# Patient Record
Sex: Male | Born: 1955 | Race: White | Hispanic: Refuse to answer | Marital: Married | Smoking: Never smoker
Health system: Southern US, Community
[De-identification: ages and names within clinical notes are randomized; demographics above are authoritative.]

## PROBLEM LIST (undated history)

## (undated) HISTORY — PX: CARPAL TUNNEL RELEASE: SHX101

## (undated) HISTORY — PX: CORNEAL TRANSPLANT: SHX108

---

## 2014-05-16 ENCOUNTER — Emergency Department (INDEPENDENT_AMBULATORY_CARE_PROVIDER_SITE_OTHER): Payer: Self-pay

## 2014-05-16 ENCOUNTER — Encounter (HOSPITAL_COMMUNITY): Payer: Self-pay | Admitting: Emergency Medicine

## 2014-05-16 ENCOUNTER — Emergency Department (INDEPENDENT_AMBULATORY_CARE_PROVIDER_SITE_OTHER)
Admission: EM | Admit: 2014-05-16 | Discharge: 2014-05-16 | Disposition: A | Discharge status: Home / Self Care | Payer: Self-pay | Source: Home / Self Care | Attending: Family Medicine | Admitting: Family Medicine

## 2014-05-16 DIAGNOSIS — S60552A Superficial foreign body of left hand, initial encounter: Secondary | ICD-10-CM

## 2014-05-16 DIAGNOSIS — Z23 Encounter for immunization: Secondary | ICD-10-CM

## 2014-05-16 MED ORDER — TETANUS-DIPHTH-ACELL PERTUSSIS 5-2.5-18.5 LF-MCG/0.5 IM SUSP
0.5000 mL | Freq: Once | INTRAMUSCULAR | Status: AC
  Administered 2014-05-16: 0.5 mL via INTRAMUSCULAR

## 2014-05-16 MED ORDER — HYDROCODONE-ACETAMINOPHEN 5-325 MG PO TABS: 1.0000 | ORAL_TABLET | ORAL | Status: DC | PRN

## 2014-05-16 MED ORDER — TETANUS-DIPHTH-ACELL PERTUSSIS 5-2.5-18.5 LF-MCG/0.5 IM SUSP
INTRAMUSCULAR | Status: AC
  Filled 2014-05-16: qty 0.5

## 2014-05-16 MED ORDER — AMOXICILLIN-POT CLAVULANATE 875-125 MG PO TABS: 1.0000 | ORAL_TABLET | Freq: Two times a day (BID) | ORAL | Status: DC

## 2014-05-16 MED ORDER — LIDOCAINE-EPINEPHRINE (PF) 2 %-1:200000 IJ SOLN
INTRAMUSCULAR | Status: AC
  Filled 2014-05-16: qty 20

## 2014-05-16 NOTE — ED Provider Notes (Signed)
CSN: [id number]     Arrival date & time 05/16/14  1737 History   First MD Initiated Contact with Patient 05/16/14 1802     Chief Complaint  Patient presents with  . Hand Injury   (Consider location/radiation/quality/duration/timing/severity/associated sxs/prior Treatment) Patient is a 59 y.o. male presenting with hand injury. The history is provided by the patient and the spouse.  Hand Injury Location:  Hand Time since incident:  2 hours Injury: yes   Mechanism of injury comment:  Puncture wound from wood paneling to left midpalm, with resultant fb palp and tingling to lif, no bleeding. Hand location:  L palm Pain details:    Quality:  Tingling and sharp   Severity:  Mild   Onset quality:  Sudden   Progression:  Unchanged Chronicity:  New Dislocation: no   Tetanus status:  Out of date Prior injury to area:  No Associated symptoms: numbness and tingling     History reviewed. No pertinent past medical history. History reviewed. No pertinent past surgical history. No family history on file. History  Substance Use Topics  . Smoking status: Never Smoker   . Smokeless tobacco: Not on file  . Alcohol Use: No    Review of Systems  Skin: Positive for wound.    Allergies  Review of patient's allergies indicates no known allergies.  Home Medications   Prior to Admission medications   Not on File   BP 144/93 mmHg  Pulse 84  Temp(Src) 99.2 F (37.3 C) (Oral)  Resp 18  SpO2 97% Physical Exam  Constitutional: He is oriented to person, place, and time. He appears well-developed and well-nourished. No distress.  Musculoskeletal: He exhibits tenderness.       Hands: Neurological: He is alert and oriented to person, place, and time.  Skin: Skin is warm and dry.  Nursing note and vitals reviewed.   ED Course  Procedures (including critical care time) Labs Review Labs Reviewed - No data to display  Imaging Review Dg Hand Complete Left  05/16/2014   CLINICAL DATA:   59 year old male with splinter in the palm of this hand. Between thumb and index finger. Initial encounter.  EXAM: LEFT HAND - COMPLETE 3+ VIEW  COMPARISON:  None.  FINDINGS: No radiopaque retained foreign body identified. Fourth finger metal ring artifact. No subcutaneous gas identified.  Normal bone mineralization. Joint spaces and alignment preserved. No acute osseous abnormality identified.  IMPRESSION: No radiopaque foreign body identified, note that wood generally is NOT radiopaque.   Electronically Signed   By: Genevie Ann M.D.   On: 05/16/2014 18:18   X-rays reviewed and report per radiologist.   MDM   1. Foreign body of left hand, initial encounter    Dr Grandville Silos in to see pt and treat.    Billy Fischer, MD 05/16/14 [phone]

## 2014-05-16 NOTE — Consult Note (Signed)
ORTHOPAEDIC CONSULTATION HISTORY & PHYSICAL REQUESTING PHYSICIAN: Billy Fischer, MD  Chief Complaint: Left hand wooden foreign body  HPI: Paul Dodson is a 59 y.o. male who sustained a wood splinter in the left hand when he was a piece of plywood slipped. He reports some tingling along the radial border of the index finger when he fully hyperextends the digit. There seems to be a palpable foreign body overlying the index ray the level of the distal palmar crease.  History reviewed. No pertinent past medical history. History reviewed. No pertinent past surgical history. History   Social History  . Marital Status: Single    Spouse Name: N/A  . Number of Children: N/A  . Years of Education: N/A   Social History Main Topics  . Smoking status: Never Smoker   . Smokeless tobacco: Not on file  . Alcohol Use: No  . Drug Use: No  . Sexual Activity: Not on file   Other Topics Concern  . None   Social History Narrative  . None   No family history on file. No Known Allergies Prior to Admission medications   Medication Sig Start Date End Date Taking? Authorizing Provider  amoxicillin-clavulanate (AUGMENTIN) 875-125 MG per tablet Take 1 tablet by mouth 2 (two) times daily. 05/16/14   Jolyn Nap, MD  HYDROcodone-acetaminophen (NORCO) 5-325 MG per tablet Take 1-2 tablets by mouth every 4 (four) hours as needed. 05/16/14   Jolyn Nap, MD   Dg Hand Complete Left  05/16/2014   CLINICAL DATA:  59 year old male with splinter in the palm of this hand. Between thumb and index finger. Initial encounter.  EXAM: LEFT HAND - COMPLETE 3+ VIEW  COMPARISON:  None.  FINDINGS: No radiopaque retained foreign body identified. Fourth finger metal ring artifact. No subcutaneous gas identified.  Normal bone mineralization. Joint spaces and alignment preserved. No acute osseous abnormality identified.  IMPRESSION: No radiopaque foreign body identified, note that wood generally is NOT radiopaque.    Electronically Signed   By: Genevie Ann M.D.   On: 05/16/2014 18:18    Positive ROS: All other systems have been reviewed and were otherwise negative with the exception of those mentioned in the HPI and as above.  Physical Exam: Vitals: Refer to EMR. Constitutional:  WD, WN, NAD HEENT:  NCAT, EOMI Neuro/Psych:  Alert & oriented to person, place, and time; appropriate mood & affect Lymphatic: No generalized extremity edema or lymphadenopathy Extremities / MSK:  The extremities are normal with respect to appearance, ranges of motion, joint stability, muscle strength/tone, sensation, & perfusion except as otherwise noted:  There is a small puncture wound along the distal palmar crease just radial to the axis of the index finger ray. Seems to be a palpable subcutaneous foreign body that points towards the hyperthenar eminence from this puncture wound. Flexor and extensor tendons are intact. There is normal light touch sensibility along the radial aspect of the index finger but some tingling reported when he extends the digit.  Assessment: Left palm likely retained wooden splinter  Plan: I discussed with him initially a plan that included removal of the splinter in the operating room. He indicated that he can as he has no health insurance, he would likely not proceed with that. After deliberation of options and his personal situation, we elected for me to try to remove foreign body in the setting of the urgent care. Lidocaine with epinephrine was used to provide a field block and hopefully some hemostasis. Hand was  then prepped with Betadine and draped. The entrance wound was opened along the path of was the expected foreign body. Subcutaneous spreading permitted access to it and it was grasped with forceps and removed. The wound was not closed but bacitracin ointment was applied and it was dressed. He will be discharged with instructions for range of motion, wound care, and the prescription for a few days  of Augmentin as well as some pain medicine. Follow-up next week Tuesday or Wednesday in the office for wound check and to check the integrity of the radial digital nerve to the index.Paul Dodson, [place], [place]  [address] Office: [phone] Mobile: [phone]

## 2014-05-16 NOTE — ED Notes (Addendum)
Left hand injury.  Handling paneling, piece jabbed into palm of hand.  Reports index finger does not feel normal to touch.  Fingers are the same color and no difference in temperature in fingers to this nurse, but patient reports finger cooler and tingling.  Bleeding controled

## 2014-05-16 NOTE — Discharge Instructions (Signed)
Change your bandage tomorrow.  You may stop putting on a bandage once there is no more active drainage.  Wood Splinters Wood splinters need to be removed because they can cause skin irritation and infection. If they are close to the surface, splinters can usually be removed easily. Deep splinters may be hard to locate and need treatment by a surgeon. SPLINTER REMOVAL Removal of splinters by your caregiver is considered a surgical procedure.   The area is carefully cleaned. You may require a small amount of anesthesia (medicine injected near the splinter to numb the tissue and lessen pain). After the splinter is removed, the area will be cleaned again. A bandage is applied.  If your splinter is under a fingernail or toenail, then a small section of the nail may need to be removed. As long as the splinter did not extend to the base of the nail, the nail usually grows back normally.  A splinter that is deeper, more contaminated, or that gets near a structure such as a bone, nerve or blood vessel may need to be removed by a Psychologist, sport and exercise.  You may need special X-rays or scans if the splinter is hard to locate.  Every attempt is made to remove the entire splinter. However, small particles may remain. Tell your caregiver if you feel that a part of the splinter was left behind. HOME CARE INSTRUCTIONS   Keep the injured area high up (elevated).  Use the injured area as little as possible.  Keep the injured area clean and dry. Follow any directions from your caregiver.  Keep any follow-up or wound check appointments. You might need a tetanus shot now if:  You have no idea when you had the last one.  You have never had a tetanus shot before.  The injured area had dirt in it. Even if you have already removed the splinter, call your caregiver to get a tetanus shot if you need one.  If you need a tetanus shot, and you decide not to get one, there is a rare chance of getting tetanus. Sickness from  tetanus can be serious. If you did get a tetanus shot, your arm may swell, get red and warm to the touch at the shot site. This is common and not a problem. SEEK MEDICAL CARE IF:   A splinter has been removed, but you are not better in a day or two.  You develop a temperature.  Signs of infection develop such as:  Redness, swelling or pus around the wound.  Red streaks spreading back from your wound towards your body. Document Released: 04/15/2004 Document Revised: 07/23/2013 Document Reviewed: 03/18/2008 [facility] Patient Information 2015 [place], [place]. This information is not intended to replace advice given to you by your health care provider. Make sure you discuss any questions you have with your health care provider.

## 2016-05-23 IMAGING — DX DG HAND COMPLETE 3+V*L*
3 series · 3 of 3 positions shown · non-contrast
Comparison: None.

CLINICAL DATA: 58-year-old male with splinter in the palm of this
hand. Between thumb and index finger. Initial encounter.

EXAM:
LEFT HAND - COMPLETE 3+ VIEW

[hand pa]
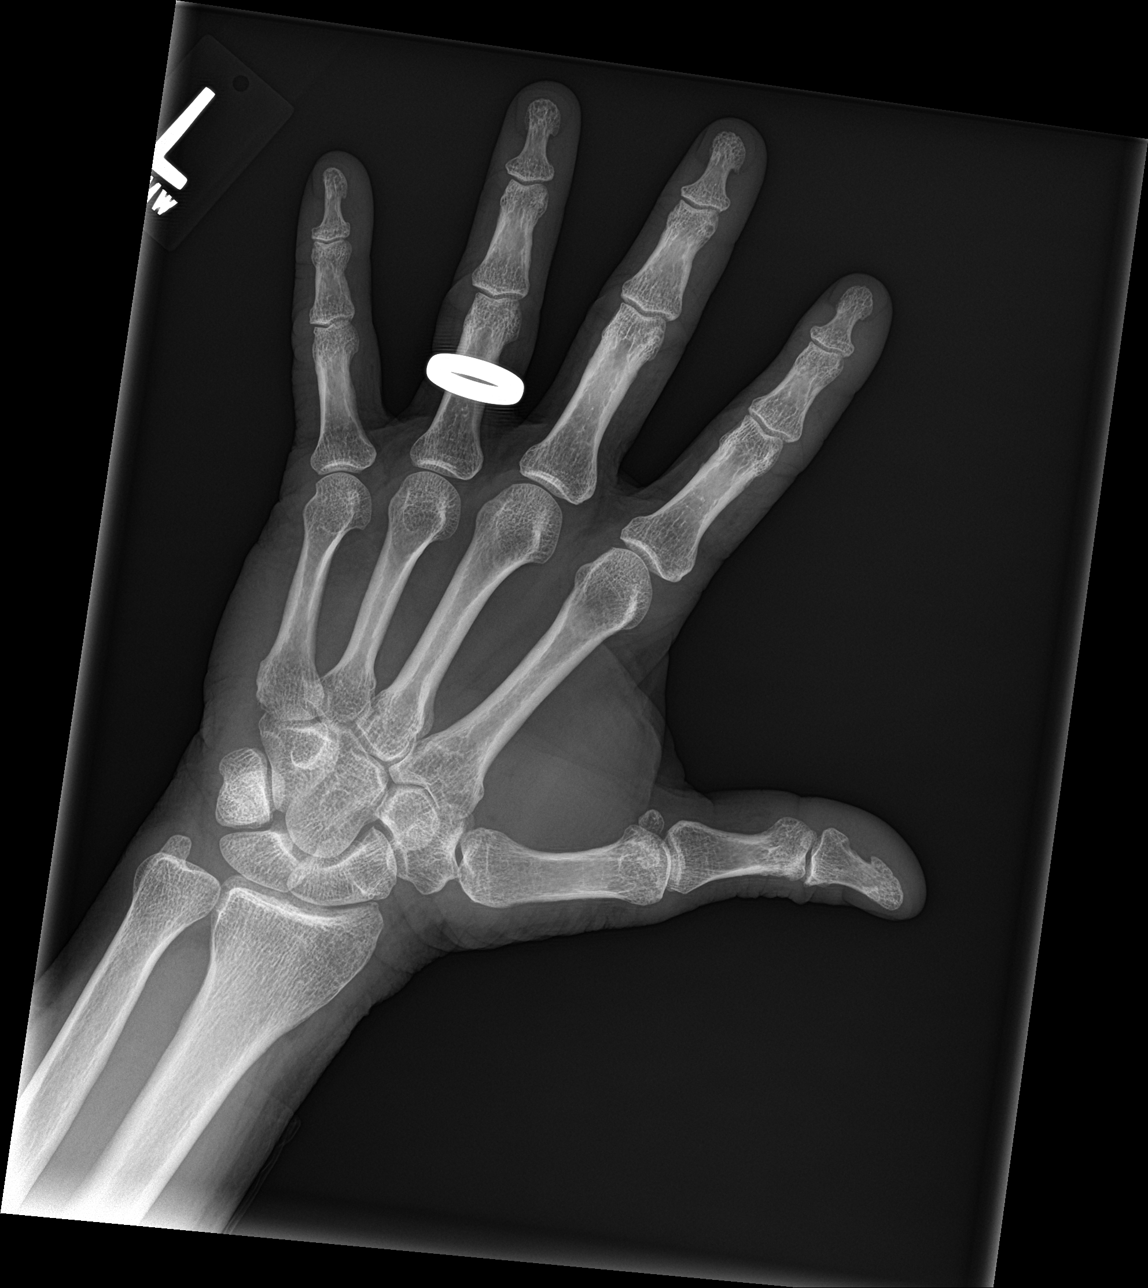

[hand obl]
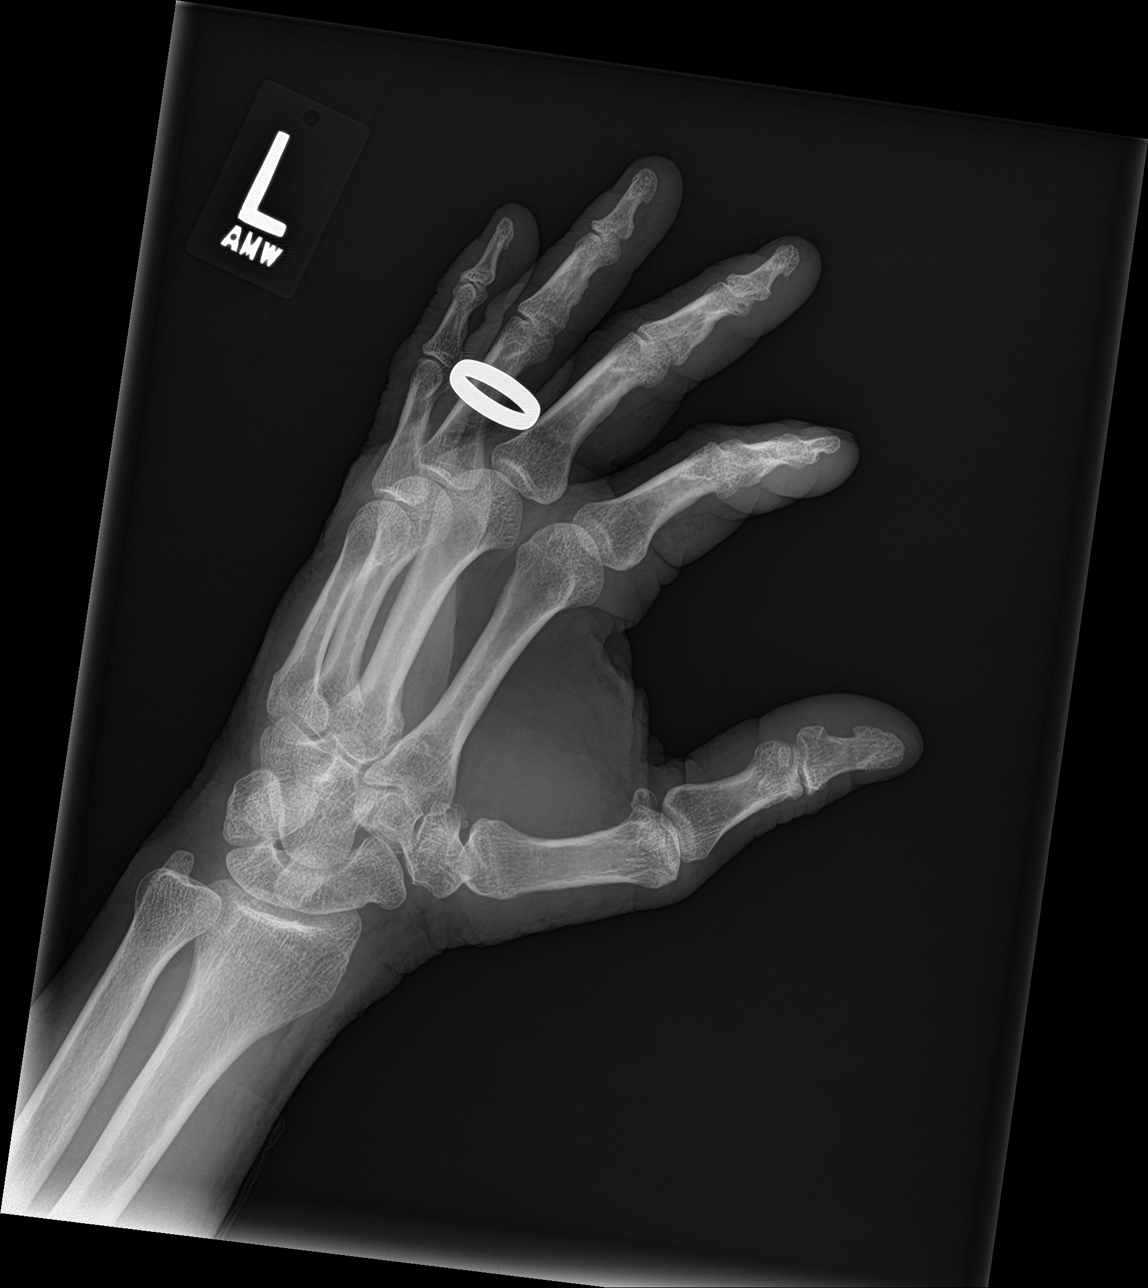

[hand lat]
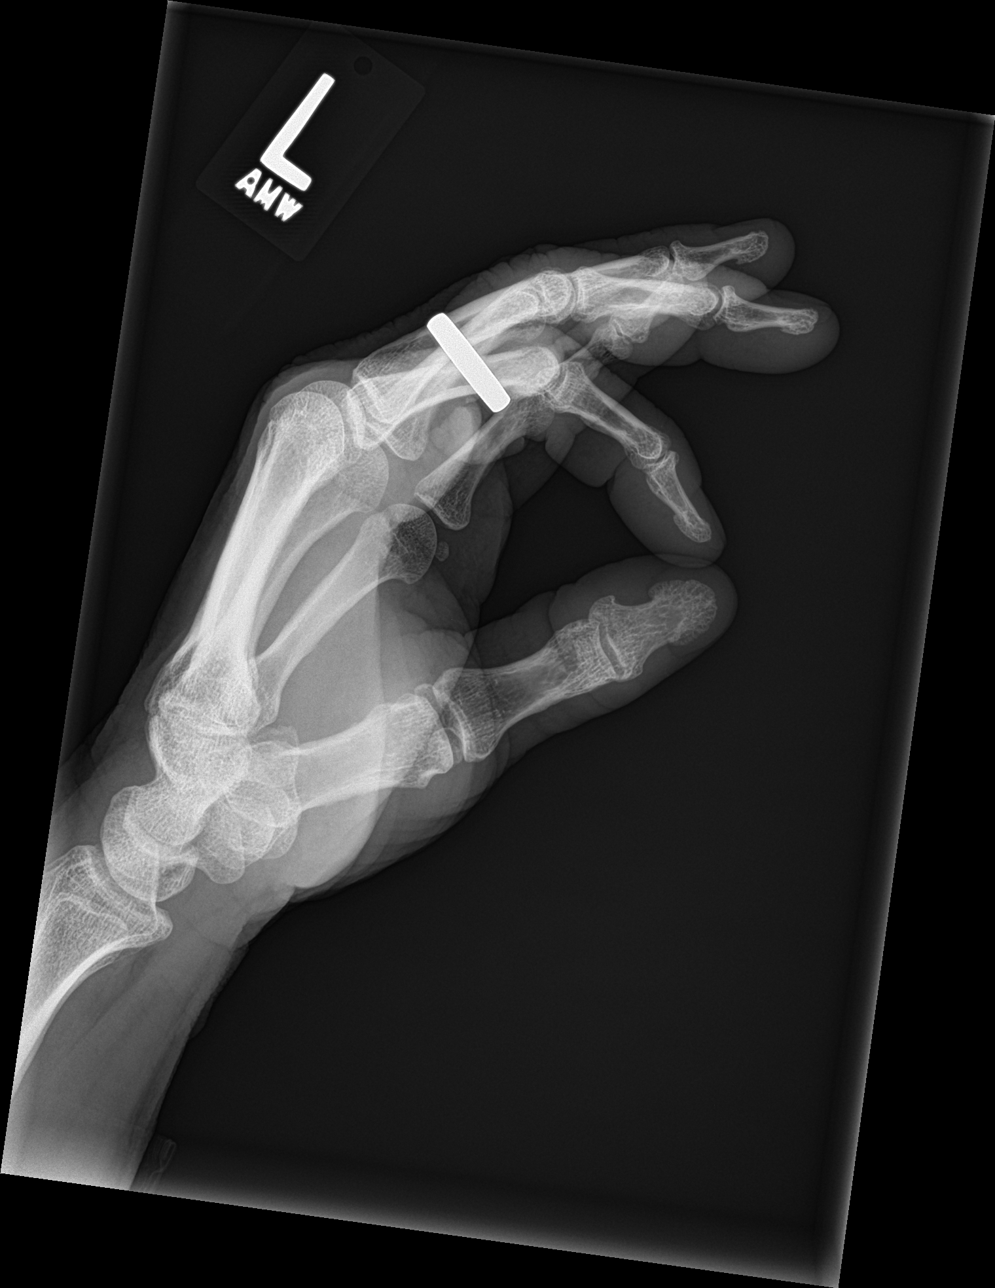

[3 of 3 positions shown; findings below may reference images not displayed]

FINDINGS: No radiopaque retained foreign body identified. Fourth finger metal
ring artifact. No subcutaneous gas identified.

Normal bone mineralization. Joint spaces and alignment preserved. No
acute osseous abnormality identified.
IMPRESSION: No radiopaque foreign body identified, note that [place] generally is
NOT radiopaque.

## 2019-12-24 ENCOUNTER — Telehealth: Payer: Self-pay

## 2019-12-24 NOTE — Telephone Encounter (Signed)
Patient called in wanting to speak with dr Louanne Skye for a "personal call"

## 2020-01-02 ENCOUNTER — Telehealth: Payer: Self-pay | Admitting: Specialist

## 2020-01-02 NOTE — Telephone Encounter (Signed)
Pt called stating Dr.Nitka told him to call when he was ready to get a nerve test for his hand; pt has never had an office visit but he works for [employer] on his house so he would like a CB from [place] to discuss further   [phone]

## 2020-01-02 NOTE — Telephone Encounter (Signed)
Will have to wait till Dr. Louanne Skye is back next week.

## 2020-01-07 NOTE — Telephone Encounter (Signed)
See message below °

## 2020-01-07 NOTE — Telephone Encounter (Signed)
Please ask Dr Nitka about this.  

## 2021-03-19 ENCOUNTER — Other Ambulatory Visit: Payer: Self-pay

## 2021-03-19 ENCOUNTER — Encounter (HOSPITAL_COMMUNITY): Payer: Self-pay | Admitting: Emergency Medicine

## 2021-03-19 ENCOUNTER — Ambulatory Visit (HOSPITAL_COMMUNITY)
Admission: EM | Admit: 2021-03-19 | Discharge: 2021-03-19 | Disposition: A | Discharge status: Home / Self Care | Payer: PPO | Attending: Internal Medicine | Admitting: Internal Medicine

## 2021-03-19 DIAGNOSIS — J208 Acute bronchitis due to other specified organisms: Secondary | ICD-10-CM

## 2021-03-19 MED ORDER — BENZONATATE 100 MG PO CAPS: 100.0000 mg | ORAL_CAPSULE | Freq: Three times a day (TID) | ORAL | 0 refills | Status: DC | PRN

## 2021-03-19 MED ORDER — GUAIFENESIN ER 600 MG PO TB12: 600.0000 mg | ORAL_TABLET | Freq: Two times a day (BID) | ORAL | 0 refills | Status: AC

## 2021-03-19 NOTE — Discharge Instructions (Signed)
Please take medications as prescribed Increase oral fluid intake There is no indication for antibiotics because this is likely viral If you have worsening symptoms please return to urgent care to be reevaluated.

## 2021-03-19 NOTE — ED Triage Notes (Signed)
Pt is present today with cough and congestion. Pt sx started x4 days ago.

## 2021-03-20 NOTE — ED Provider Notes (Signed)
[place]    CSN: [id number] Arrival date & time: 03/19/21  1029      History   Chief Complaint Chief Complaint  Patient presents with   Cough    congestion    HPI Paul Dodson is a 65 y.o. male comes to the urgent care with a 4-day history of productive cough and nasal congestion.  Patient's symptoms started 4 days ago and has been persistent.  Cough is productive of discolored sputum.  He denies any fever or chills.  No nausea or vomiting.  No diarrhea.  Patient's wife has had similar upper respiratory infection symptoms a few weeks ago.  Patient denies any wheezing.  No chest pain or chest pressure.  HPI  History reviewed. No pertinent past medical history.  There are no problems to display for this patient.   History reviewed. No pertinent surgical history.     Home Medications    Prior to Admission medications   Medication Sig Start Date End Date Taking? Authorizing Provider  benzonatate (TESSALON) 100 MG capsule Take 1 capsule (100 mg total) by mouth 3 (three) times daily as needed for cough. 03/19/21  Yes Furkan Keenum, Myrene Galas, MD  guaiFENesin (MUCINEX) 600 MG 12 hr tablet Take 1 tablet (600 mg total) by mouth 2 (two) times daily for 7 days. 03/19/21 03/26/21 Yes Yeraldi Fidler, Myrene Galas, MD    Family History History reviewed. No pertinent family history.  Social History Social History   Tobacco Use   Smoking status: Never  Substance Use Topics   Alcohol use: No   Drug use: No     Allergies   Patient has no known allergies.   Review of Systems Review of Systems  Constitutional: Negative.   HENT:  Positive for congestion. Negative for sinus pressure, sinus pain and sore throat.   Respiratory:  Positive for cough. Negative for wheezing and stridor.   Neurological: Negative.     Physical Exam Triage Vital Signs ED Triage Vitals  Enc Vitals Group     BP 03/19/21 1201 (!) 172/105     Pulse Rate 03/19/21 1201 (!) 107     Resp 03/19/21 1201 18      Temp 03/19/21 1201 98.5 F (36.9 C)     Temp Source 03/19/21 1201 Oral     SpO2 03/19/21 1201 94 %     Weight --      Height --      Head Circumference --      Peak Flow --      Pain Score 03/19/21 1159 0     Pain Loc --      Pain Edu? --      Excl. in [place]? --    No data found.  Updated Vital Signs BP (!) 172/105    Pulse (!) 107    Temp 98.5 F (36.9 C) (Oral)    Resp 18    SpO2 94%   Visual Acuity Right Eye Distance:   Left Eye Distance:   Bilateral Distance:    Right Eye Near:   Left Eye Near:    Bilateral Near:     Physical Exam Vitals and nursing note reviewed.  Constitutional:      General: He is not in acute distress.    Appearance: He is not ill-appearing.  HENT:     Right Ear: Tympanic membrane normal.     Left Ear: Tympanic membrane normal.  Cardiovascular:     Rate and Rhythm: Normal rate and regular  rhythm.     Pulses: Normal pulses.     Heart sounds: Normal heart sounds.  Pulmonary:     Effort: Pulmonary effort is normal. No respiratory distress.     Breath sounds: Normal breath sounds. No wheezing or rhonchi.  Abdominal:     General: Bowel sounds are normal.     Palpations: Abdomen is soft.  Neurological:     Mental Status: He is alert.     UC Treatments / Results  Labs (all labs ordered are listed, but only abnormal results are displayed) Labs Reviewed - No data to display  EKG   Radiology No results found.  Procedures Procedures (including critical care time)  Medications Ordered in UC Medications - No data to display  Initial Impression / Assessment and Plan / UC Course  I have reviewed the triage vital signs and the nursing notes.  Pertinent labs & imaging results that were available during my care of the patient were reviewed by me and considered in my medical decision making (see chart for details).     1.  Acute viral bronchitis: Tessalon Perles as needed for cough Mucinex twice daily No indication for  testing Maintain adequate hydration Return to urgent care if symptoms worsen No indication for antibiotic use.  Final Clinical Impressions(s) / UC Diagnoses   Final diagnoses:  Acute viral bronchitis     Discharge Instructions      Please take medications as prescribed Increase oral fluid intake There is no indication for antibiotics because this is likely viral If you have worsening symptoms please return to urgent care to be reevaluated.   ED Prescriptions     Medication Sig Dispense Auth. Provider   benzonatate (TESSALON) 100 MG capsule Take 1 capsule (100 mg total) by mouth 3 (three) times daily as needed for cough. 30 capsule Lucienne Sawyers, Myrene Galas, MD   guaiFENesin (MUCINEX) 600 MG 12 hr tablet Take 1 tablet (600 mg total) by mouth 2 (two) times daily for 7 days. 14 tablet Kushal Saunders, Myrene Galas, MD      PDMP not reviewed this encounter.   Chase Picket, MD 03/20/21 [phone]

## 2021-08-24 ENCOUNTER — Ambulatory Visit (INDEPENDENT_AMBULATORY_CARE_PROVIDER_SITE_OTHER): Payer: PPO | Admitting: Nurse Practitioner

## 2021-08-24 ENCOUNTER — Encounter: Payer: Self-pay | Admitting: Nurse Practitioner

## 2021-08-24 VITALS — BP 160/90 | HR 98 | Temp 98.0°F | Ht 72.5 in | Wt 234.0 lb

## 2021-08-24 DIAGNOSIS — R030 Elevated blood-pressure reading, without diagnosis of hypertension: Secondary | ICD-10-CM | POA: Diagnosis not present

## 2021-08-24 DIAGNOSIS — Z0000 Encounter for general adult medical examination without abnormal findings: Secondary | ICD-10-CM

## 2021-08-24 DIAGNOSIS — Z6831 Body mass index (BMI) 31.0-31.9, adult: Secondary | ICD-10-CM

## 2021-08-24 DIAGNOSIS — E6609 Other obesity due to excess calories: Secondary | ICD-10-CM

## 2021-08-24 DIAGNOSIS — Z125 Encounter for screening for malignant neoplasm of prostate: Secondary | ICD-10-CM

## 2021-08-24 NOTE — Patient Instructions (Signed)
Blood pressure is elevated in office. Get a cuff and check it 3 times a week for me I want to see you in a month to go over the number and see if you need blood pressure medications When you come to get the labs just have water only no food prior to getting the blood drawn Follow up with me in 1 month

## 2021-08-24 NOTE — Assessment & Plan Note (Signed)
Elevated blood pressure in office and on repeat.  Patient states he has been told in the past his blood pressures been elevated does have strong family history of hypertension.  He will check blood pressure 3 times weekly over the next month and follow-up with me in 1 month to discuss blood pressures and potentially going on blood pressure medication

## 2021-08-24 NOTE — Progress Notes (Signed)
New Patient Office Visit  Subjective    Patient ID: Paul Dodson, male    DOB: 1956-01-12  Age: 66 y.o. MRN: [id number]  CC:  Chief Complaint  Patient presents with   Establish Care    Has been a long time since having a PCP.     HPI Paul Dodson presents to establish care  for complete physical and follow up of chronic conditions.  Immunizations: -Tetanus:2016 -Influenza: out of season -Covid-19: refused -Shingles: refused -Pneumonia: refused  -HPV: aged out  Diet: [place].  3 meals a day. Will have some coffee and water. Occasionaly kool aid and tea. Exercise: No regular exercise out side of Employment   Eye exam: sees specialist last 2 years. Wears hard contacts Dental exam:  upper and lower plates. No regular dental follow up   Colonoscopy: Never. Did offer he will check with wife and see which place he would like to be referred to. Lung Cancer Screening: NA  Dexa: NA  PSA: Due  Sleep: Goes to bed aroun 1030 and gets up 630-7. Feels rested. Snore    Outpatient Encounter Medications as of 08/24/2021  Medication Sig   Cyanocobalamin (B-12 PO) Take by mouth.   [DISCONTINUED] benzonatate (TESSALON) 100 MG capsule Take 1 capsule (100 mg total) by mouth 3 (three) times daily as needed for cough.   No facility-administered encounter medications on file as of 08/24/2021.    No past medical history on file.  Past Surgical History:  Procedure Laterality Date   CARPAL TUNNEL RELEASE Left    CORNEAL TRANSPLANT Bilateral    in his 70s    Family History  Problem Relation Age of Onset   Cancer Mother 71       breast   Hyperlipidemia Father    CAD Father        CABG    Social History   Socioeconomic History   Marital status: Married    Spouse name: Not on file   Number of children: 2   Years of education: Not on file   Highest education level: Not on file  Occupational History   Not on file  Tobacco Use   Smoking status: Never    Passive  exposure: Past   Smokeless tobacco: Never  Substance and Sexual Activity   Alcohol use: No   Drug use: No   Sexual activity: Not on file  Other Topics Concern   Not on file  Social History Narrative   Son and daughter      Hobbies: Chief Strategy Officer and camping   Social Determinants of Health   Financial Resource Strain: Not on file  Food Insecurity: Not on file  Transportation Needs: Not on file  Physical Activity: Not on file  Stress: Not on file  Social Connections: Not on file  Intimate Partner Violence: Not on file    Review of Systems  Constitutional:  Negative for chills, fever and malaise/fatigue.  Respiratory:  Negative for cough and shortness of breath.   Cardiovascular:  Negative for chest pain and leg swelling.  Gastrointestinal:  Negative for abdominal pain, constipation, diarrhea, nausea and vomiting.       BM daily  Genitourinary:  Negative for dysuria and hematuria.       Nocturia negative  Difficulty starting the stream  Neurological:  Positive for tingling. Negative for dizziness, weakness and headaches.  Psychiatric/Behavioral:  Negative for hallucinations and suicidal ideas.        Objective    BP (!) 160/90 (  BP Location: Left Arm, Patient Position: Sitting, Cuff Size: Large) Comment: states he always has elevated bp in dr office  Pulse 98   Temp 98 F (36.7 C)   Ht 6' 0.5" (1.842 m)   Wt 234 lb (106.1 kg)   SpO2 97%   BMI 31.30 kg/m   Physical Exam Vitals and nursing note reviewed. Exam conducted with a chaperone present [employer] [place], RMA).  Constitutional:      Appearance: Normal appearance.  HENT:     Right Ear: Tympanic membrane, ear canal and external ear normal.     Left Ear: Tympanic membrane, ear canal and external ear normal.     Mouth/Throat:     Mouth: Mucous membranes are moist.     Pharynx: Oropharynx is clear.  Eyes:     Extraocular Movements: Extraocular movements intact.     Pupils: Pupils are equal, round, and  reactive to light.     Comments: Wears hard contacts  Cardiovascular:     Rate and Rhythm: Normal rate and regular rhythm.     Pulses: Normal pulses.     Heart sounds: Normal heart sounds.  Pulmonary:     Effort: Pulmonary effort is normal.     Breath sounds: Normal breath sounds.  Abdominal:     General: Bowel sounds are normal. There is no distension.     Palpations: There is no mass.     Tenderness: There is no abdominal tenderness.     Hernia: No hernia is present. There is no hernia in the left inguinal area or right inguinal area.  Genitourinary:    Penis: Normal.      Testes: Normal.     Epididymis:     Right: Normal.     Left: Normal.  Musculoskeletal:     Right lower leg: No edema.     Left lower leg: No edema.  Lymphadenopathy:     Cervical: No cervical adenopathy.     Lower Body: No right inguinal adenopathy. No left inguinal adenopathy.  Skin:    General: Skin is warm.  Neurological:     General: No focal deficit present.     Mental Status: He is alert.     Deep Tendon Reflexes:     Reflex Scores:      Bicep reflexes are 2+ on the right side and 2+ on the left side.      Patellar reflexes are 2+ on the right side and 2+ on the left side.    Comments: Bilateral upper and lower extremity strength 5/5  Psychiatric:        Mood and Affect: Mood normal.        Behavior: Behavior normal.        Thought Content: Thought content normal.        Judgment: Judgment normal.        Assessment & Plan:   Problem List Items Addressed This Visit       Other   Class 1 obesity due to excess calories without serious comorbidity with body mass index (BMI) of 31.0 to 31.9 in adult   Elevated blood pressure reading in office without diagnosis of hypertension    Elevated blood pressure in office and on repeat.  Patient states he has been told in the past his blood pressures been elevated does have strong family history of hypertension.  He will check blood pressure 3 times  weekly over the next month and follow-up with me in 1 month to discuss  blood pressures and potentially going on blood pressure medication       Preventative health care - Primary    Discussed age-appropriate immunizations and screening exams today in office.       Relevant Orders   CBC   Comprehensive metabolic panel   Hemoglobin A1c   Lipid panel   Other Visit Diagnoses     Screening for prostate cancer       Relevant Orders   PSA       Return in about 4 weeks (around 09/21/2021) for BP recheck .   Romilda Garret, NP

## 2021-08-24 NOTE — Assessment & Plan Note (Signed)
Discussed age-appropriate immunizations and screening exams today in office.

## 2021-08-25 ENCOUNTER — Telehealth: Payer: Self-pay | Admitting: Nurse Practitioner

## 2021-08-25 ENCOUNTER — Other Ambulatory Visit (INDEPENDENT_AMBULATORY_CARE_PROVIDER_SITE_OTHER): Payer: PPO

## 2021-08-25 ENCOUNTER — Other Ambulatory Visit: Payer: Self-pay | Admitting: Nurse Practitioner

## 2021-08-25 DIAGNOSIS — Z125 Encounter for screening for malignant neoplasm of prostate: Secondary | ICD-10-CM

## 2021-08-25 DIAGNOSIS — Z1211 Encounter for screening for malignant neoplasm of colon: Secondary | ICD-10-CM

## 2021-08-25 DIAGNOSIS — Z0000 Encounter for general adult medical examination without abnormal findings: Secondary | ICD-10-CM | POA: Diagnosis not present

## 2021-08-25 LAB — LIPID PANEL
Cholesterol: 155 mg/dL (ref 0–200)
HDL: 42.6 mg/dL (ref >39.00)
LDL Cholesterol: 94 mg/dL (ref 0–99)
NonHDL: 112.83
Total CHOL/HDL Ratio: 4
Triglycerides: 92 mg/dL (ref 0.0–149.0)
VLDL: 18.4 mg/dL (ref 0.0–40.0)

## 2021-08-25 LAB — COMPREHENSIVE METABOLIC PANEL
ALT: 19 U/L (ref 0–53)
AST: 17 U/L (ref 0–37)
Albumin: 4.1 g/dL (ref 3.5–5.2)
Alkaline Phosphatase: 60 U/L (ref 39–117)
BUN: 15 mg/dL (ref 6–23)
CO2: 30 mEq/L (ref 19–32)
Calcium: 9.2 mg/dL (ref 8.4–10.5)
Chloride: 102 mEq/L (ref 96–112)
Creatinine, Ser: 0.97 mg/dL (ref 0.40–1.50)
GFR: 81.94 mL/min (ref >60.00)
Glucose, Bld: 99 mg/dL (ref 70–99)
Potassium: 4.9 mEq/L (ref 3.5–5.1)
Sodium: 139 mEq/L (ref 135–145)
Total Bilirubin: 0.8 mg/dL (ref 0.2–1.2)
Total Protein: 6.8 g/dL (ref 6.0–8.3)

## 2021-08-25 LAB — CBC
HCT: 44.3 % (ref 39.0–52.0)
Hemoglobin: 14.9 g/dL (ref 13.0–17.0)
MCHC: 33.7 g/dL (ref 30.0–36.0)
MCV: 97.7 fl (ref 78.0–100.0)
Platelets: 174 10*3/uL (ref 150.0–400.0)
RBC: 4.53 Mil/uL (ref 4.22–5.81)
RDW: 14.1 % (ref 11.5–15.5)
WBC: 3.8 10*3/uL — ABNORMAL LOW (ref 4.0–10.5)

## 2021-08-25 LAB — HEMOGLOBIN A1C: Hgb A1c MFr Bld: 6.1 % (ref 4.6–6.5)

## 2021-08-25 LAB — PSA: PSA: 0.82 ng/mL (ref 0.10–4.00)

## 2021-08-25 NOTE — Telephone Encounter (Signed)
Patient called back. His wife is scheduled with [facility] for colonoscopy on 09/18/21 and patient would like to get scheduled for his the day before hers so they can take each other to the appointments. Advised patient I would let Catalina Antigua know to put order in and when [facility] calls to set this up to let them know how they want that scheduled. Patient verbalized understanding

## 2021-08-25 NOTE — Telephone Encounter (Signed)
Pt was in office requesting a call back to discuss rescheduling his colonoscopy . Please advise [phone]

## 2021-08-25 NOTE — Telephone Encounter (Signed)
Referral placed.

## 2021-08-26 ENCOUNTER — Telehealth: Payer: Self-pay

## 2021-08-26 NOTE — Telephone Encounter (Signed)
Called no answer no voicemail

## 2021-08-28 ENCOUNTER — Other Ambulatory Visit: Payer: Self-pay

## 2021-08-28 ENCOUNTER — Telehealth: Payer: Self-pay

## 2021-08-28 DIAGNOSIS — Z1211 Encounter for screening for malignant neoplasm of colon: Secondary | ICD-10-CM

## 2021-08-28 MED ORDER — NA SULFATE-K SULFATE-MG SULF 17.5-3.13-1.6 GM/177ML PO SOLN: 1.0000 | Freq: Once | ORAL | 0 refills | Status: AC

## 2021-08-28 NOTE — Telephone Encounter (Signed)
Gastroenterology Pre-Procedure Review  Request Date: 09/17/21 Requesting Physician: Dr. Vicente Males  PATIENT REVIEW QUESTIONS: The patient responded to the following health history questions as indicated:    1. Are you having any GI issues? no 2. Do you have a personal history of Polyps? no 3. Do you have a family history of Colon Cancer or Polyps? no 4. Diabetes Mellitus? no 5. Joint replacements in the past 12 months?no 6. Major health problems in the past 3 months?no 7. Any artificial heart valves, MVP, or defibrillator?no    MEDICATIONS & ALLERGIES:    Patient reports the following regarding taking any anticoagulation/antiplatelet therapy:   Plavix, Coumadin, Eliquis, Xarelto, Lovenox, Pradaxa, Brilinta, or Effient? no Aspirin? no  Patient confirms/reports the following medications:  Current Outpatient Medications  Medication Sig Dispense Refill   Cyanocobalamin (B-12 PO) Take by mouth.     No current facility-administered medications for this visit.    Patient confirms/reports the following allergies:  No Known Allergies  No orders of the defined types were placed in this encounter.   AUTHORIZATION INFORMATION Primary Insurance: 1D#: Group #:  Secondary Insurance: 1D#: Group #:  SCHEDULE INFORMATION: Date: 09/17/21 Time: Location: [place]

## 2021-09-12 DIAGNOSIS — Z87891 Personal history of nicotine dependence: Secondary | ICD-10-CM | POA: Diagnosis not present

## 2021-09-12 DIAGNOSIS — E663 Overweight: Secondary | ICD-10-CM | POA: Diagnosis not present

## 2021-09-17 ENCOUNTER — Ambulatory Visit
Admission: RE | Admit: 2021-09-17 | Discharge: 2021-09-17 | Disposition: A | Discharge status: Home / Self Care | Payer: PPO | Attending: Gastroenterology | Admitting: Gastroenterology

## 2021-09-17 ENCOUNTER — Ambulatory Visit: Payer: PPO | Admitting: Registered Nurse

## 2021-09-17 ENCOUNTER — Encounter
Admission: RE | Disposition: A | Discharge status: Home / Self Care | Payer: Self-pay | Source: Home / Self Care | Attending: Gastroenterology

## 2021-09-17 ENCOUNTER — Encounter: Payer: Self-pay | Admitting: Gastroenterology

## 2021-09-17 ENCOUNTER — Other Ambulatory Visit: Payer: Self-pay

## 2021-09-17 DIAGNOSIS — Z1211 Encounter for screening for malignant neoplasm of colon: Secondary | ICD-10-CM | POA: Diagnosis not present

## 2021-09-17 DIAGNOSIS — D122 Benign neoplasm of ascending colon: Secondary | ICD-10-CM | POA: Insufficient documentation

## 2021-09-17 DIAGNOSIS — D120 Benign neoplasm of cecum: Secondary | ICD-10-CM | POA: Diagnosis not present

## 2021-09-17 DIAGNOSIS — K5730 Diverticulosis of large intestine without perforation or abscess without bleeding: Secondary | ICD-10-CM | POA: Diagnosis not present

## 2021-09-17 DIAGNOSIS — K635 Polyp of colon: Secondary | ICD-10-CM | POA: Diagnosis not present

## 2021-09-17 DIAGNOSIS — D126 Benign neoplasm of colon, unspecified: Secondary | ICD-10-CM | POA: Diagnosis not present

## 2021-09-17 HISTORY — PX: COLONOSCOPY WITH PROPOFOL: SHX5780

## 2021-09-17 SURGERY — COLONOSCOPY WITH PROPOFOL
Anesthesia: General

## 2021-09-17 MED ORDER — PROPOFOL 500 MG/50ML IV EMUL
INTRAVENOUS | Status: DC | PRN
  Administered 2021-09-17: 100 ug/kg/min via INTRAVENOUS

## 2021-09-17 MED ORDER — LIDOCAINE HCL (CARDIAC) PF 100 MG/5ML IV SOSY
PREFILLED_SYRINGE | INTRAVENOUS | Status: DC | PRN
  Administered 2021-09-17: 60 mg via INTRAVENOUS

## 2021-09-17 MED ORDER — PROPOFOL 10 MG/ML IV BOLUS
INTRAVENOUS | Status: DC | PRN
  Administered 2021-09-17: 20 mg via INTRAVENOUS
  Administered 2021-09-17: 50 mg via INTRAVENOUS

## 2021-09-17 MED ORDER — LIDOCAINE HCL (PF) 2 % IJ SOLN
INTRAMUSCULAR | Status: AC
  Filled 2021-09-17: qty 5

## 2021-09-17 MED ORDER — PROPOFOL 1000 MG/100ML IV EMUL
INTRAVENOUS | Status: AC
  Filled 2021-09-17: qty 100

## 2021-09-17 MED ORDER — SODIUM CHLORIDE 0.9 % IV SOLN: INTRAVENOUS | Status: DC

## 2021-09-17 NOTE — H&P (Signed)
     Jonathon Bellows, MD [address], [place], [place], [place], [address] [address], [place], [place], [place], [address] Phone: [phone]  Fax: [phone]  Primary Care Physician:  Michela Pitcher, NP   Pre-Procedure History & Physical: HPI:  Raymond Bhardwaj is a 66 y.o. male is here for an colonoscopy.   History reviewed. No pertinent past medical history.  Past Surgical History:  Procedure Laterality Date   CARPAL TUNNEL RELEASE Left    CORNEAL TRANSPLANT Bilateral    in his 65s    Prior to Admission medications   Medication Sig Start Date End Date Taking? Authorizing Provider  Cyanocobalamin (B-12 PO) Take by mouth.    [provider]    Allergies as of 08/28/2021   (No Known Allergies)    Family History  Problem Relation Age of Onset   Cancer Mother 25       breast   Hyperlipidemia Father    CAD Father        CABG    Social History   Socioeconomic History   Marital status: Married    Spouse name: Not on file   Number of children: 2   Years of education: Not on file   Highest education level: Not on file  Occupational History   Not on file  Tobacco Use   Smoking status: Never    Passive exposure: Past   Smokeless tobacco: Never  Vaping Use   Vaping Use: Never used  Substance and Sexual Activity   Alcohol use: No   Drug use: No   Sexual activity: Not on file  Other Topics Concern   Not on file  Social History Narrative   Son and daughter      Hobbies: Chief Strategy Officer and camping   Social Determinants of Health   Financial Resource Strain: Not on file  Food Insecurity: Not on file  Transportation Needs: Not on file  Physical Activity: Not on file  Stress: Not on file  Social Connections: Not on file  Intimate Partner Violence: Not on file    Review of Systems: See HPI, otherwise negative ROS  Physical Exam: BP (!) 158/98   Pulse 64   Temp (!) 96.9 F (36.1 C) (Temporal)   Resp 20   Ht 6' 2.5" (1.892 m)   Wt 105.2 kg    SpO2 100%   BMI 29.39 kg/m  General:   Alert,  pleasant and cooperative in NAD Head:  Normocephalic and atraumatic. Neck:  Supple; no masses or thyromegaly. Lungs:  Clear throughout to auscultation, normal respiratory effort.    Heart:  +S1, +S2, Regular rate and rhythm, No edema. Abdomen:  Soft, nontender and nondistended. Normal bowel sounds, without guarding, and without rebound.   Neurologic:  Alert and  oriented x4;  grossly normal neurologically.  Impression/Plan: Kam Rahimi is here for an colonoscopy to be performed for Screening colonoscopy average risk   Risks, benefits, limitations, and alternatives regarding  colonoscopy have been reviewed with the patient.  Questions have been answered.  All parties agreeable.   Jonathon Bellows, MD  09/17/2021, 8:15 AM

## 2021-09-17 NOTE — Anesthesia Preprocedure Evaluation (Signed)
Anesthesia Evaluation  Patient identified by MRN, date of birth, ID band Patient awake    Reviewed: Allergy & Precautions, NPO status , Patient's Chart, lab work & pertinent test results  History of Anesthesia Complications Negative for: history of anesthetic complications  Airway Mallampati: II  TM Distance: >3 FB Neck ROM: Full    Dental  (+) Upper Dentures, Lower Dentures   Pulmonary neg pulmonary ROS, neg sleep apnea, neg COPD, Patient abstained from smoking.Not current smoker,    Pulmonary exam normal breath sounds clear to auscultation       Cardiovascular Exercise Tolerance: Good METS(-) hypertension(-) CAD and (-) Past MI negative cardio ROS  (-) dysrhythmias  Rhythm:Regular Rate:Normal - Systolic murmurs    Neuro/Psych negative neurological ROS  negative psych ROS   GI/Hepatic neg GERD  ,(+)     (-) substance abuse  ,   Endo/Other  neg diabetes  Renal/GU negative Renal ROS     Musculoskeletal   Abdominal   Peds  Hematology   Anesthesia Other Findings History reviewed. No pertinent past medical history.  Reproductive/Obstetrics                             Anesthesia Physical Anesthesia Plan  ASA: 1  Anesthesia Plan: General   Post-op Pain Management: Minimal or no pain anticipated   Induction: Intravenous  PONV Risk Score and Plan: 2 and Propofol infusion, TIVA and Ondansetron  Airway Management Planned: Nasal Cannula  Additional Equipment: None  Intra-op Plan:   Post-operative Plan:   Informed Consent: I have reviewed the patients History and Physical, chart, labs and discussed the procedure including the risks, benefits and alternatives for the proposed anesthesia with the patient or authorized representative who has indicated his/her understanding and acceptance.     Dental advisory given  Plan Discussed with: CRNA and Surgeon  Anesthesia Plan Comments:  (Discussed risks of anesthesia with patient, including possibility of difficulty with spontaneous ventilation under anesthesia necessitating airway intervention, PONV, and rare risks such as cardiac or respiratory or neurological events, and allergic reactions. Discussed the role of CRNA in patient's perioperative care. Patient understands.)        Anesthesia Quick Evaluation

## 2021-09-17 NOTE — Op Note (Signed)
[facility] Gastroenterology Patient Name: Paul Dodson Procedure Date: 09/17/2021 8:14 AM MRN: [id number] Account #: [id number] Date of Birth: [birth year] Admit Type: Outpatient Age: 66 Room: [facility] ENDO ROOM 3 Gender: Male Note Status: Finalized Instrument Name: Jasper Riling [id number] Procedure:             Colonoscopy Indications:           Screening for colorectal malignant neoplasm Providers:             Jonathon Bellows MD, MD Referring MD:          Jonathon Bellows MD, MD (Referring MD), Alyson Locket. Charmian Muff                         (Referring MD) Medicines:             Monitored Anesthesia Care Complications:         No immediate complications. Procedure:             Pre-Anesthesia Assessment:                        - Prior to the procedure, a History and Physical was                         performed, and patient medications, allergies and                         sensitivities were reviewed. The patient's tolerance                         of previous anesthesia was reviewed.                        - The risks and benefits of the procedure and the                         sedation options and risks were discussed with the                         patient. All questions were answered and informed                         consent was obtained.                        - ASA Grade Assessment: II - A patient with mild                         systemic disease.                        After obtaining informed consent, the colonoscope was                         passed under direct vision. Throughout the procedure,                         the patient's blood pressure, pulse, and oxygen                         saturations were  monitored continuously. The                         Colonoscope was introduced through the anus and                         advanced to the the cecum, identified by the                         appendiceal orifice. The colonoscopy was performed                          with ease. The patient tolerated the procedure well.                         The quality of the bowel preparation was fair. Findings:      The perianal and digital rectal examinations were normal.      A 3 mm polyp was found in the cecum. The polyp was sessile. The polyp       was removed with a jumbo cold forceps. Resection and retrieval were       complete.      Four sessile polyps were found in the ascending colon. The polyps were 5       to 7 mm in size. These polyps were removed with a cold snare. Resection       and retrieval were complete.      A 5 mm polyp was found in the sigmoid colon. The polyp was sessile. The       polyp was removed with a cold snare. Resection and retrieval were       complete.      Multiple small-mouthed diverticula were found in the sigmoid colon.      The exam was otherwise without abnormality on direct and retroflexion       views. Impression:            - Preparation of the colon was fair.                        - One 3 mm polyp in the cecum, removed with a jumbo                         cold forceps. Resected and retrieved.                        - Four 5 to 7 mm polyps in the ascending colon,                         removed with a cold snare. Resected and retrieved.                        - One 5 mm polyp in the sigmoid colon, removed with a                         cold snare. Resected and retrieved.                        - Diverticulosis in the sigmoid colon.                        -  The examination was otherwise normal on direct and                         retroflexion views. Recommendation:        - Discharge patient to home.                        - Resume previous diet [Duration].                        - Continue present medications.                        - Await pathology results.                        - Repeat colonoscopy in 1 year because the bowel                         preparation was suboptimal. Procedure Code(s):     ---  Professional ---                        [phone], Colonoscopy, flexible; with removal of                         tumor(s), polyp(s), or other lesion(s) by snare                         technique                        45380, 56, Colonoscopy, flexible; with biopsy, single                         or multiple Diagnosis Code(s):     --- Professional ---                        K63.5, Polyp of colon                        Z12.11, Encounter for screening for malignant neoplasm                         of colon                        K57.30, Diverticulosis of large intestine without                         perforation or abscess without bleeding CPT copyright 2019 American Medical Association. All rights reserved. The codes documented in this report are preliminary and upon coder review may  be revised to meet current compliance requirements. Jonathon Bellows, MD Jonathon Bellows MD, MD 09/17/2021 8:45:37 AM This report has been signed electronically. Number of Addenda: 0 Note Initiated On: 09/17/2021 8:14 AM Scope Withdrawal Time: 0 hours 16 minutes 51 seconds  Total Procedure Duration: 0 hours 19 minutes 13 seconds  Estimated Blood Loss:  Estimated blood loss: none.      [facility]

## 2021-09-17 NOTE — Anesthesia Postprocedure Evaluation (Signed)
Anesthesia Post Note  Patient: Paul Dodson  Procedure(s) Performed: COLONOSCOPY WITH PROPOFOL  Patient location during evaluation: Endoscopy Anesthesia Type: General Level of consciousness: awake and alert Pain management: pain level controlled Vital Signs Assessment: post-procedure vital signs reviewed and stable Respiratory status: spontaneous breathing, nonlabored ventilation, respiratory function stable and patient connected to nasal cannula oxygen Cardiovascular status: blood pressure returned to baseline and stable Postop Assessment: no apparent nausea or vomiting Anesthetic complications: no   No notable events documented.   Last Vitals:  Vitals:   09/17/21 0847 09/17/21 0849  BP:  106/90  Pulse: 68 65  Resp: 16 17  Temp: (!) 35.9 C   SpO2: 97% 97%    Last Pain:  Vitals:   09/17/21 0847  TempSrc: Temporal  PainSc: 0-No pain                 Arita Miss

## 2021-09-17 NOTE — Transfer of Care (Signed)
Immediate Anesthesia Transfer of Care Note  Patient: Paul Dodson  Procedure(s) Performed: COLONOSCOPY WITH PROPOFOL  Patient Location: PACU  Anesthesia Type:General  Level of Consciousness: awake, alert  and oriented  Airway & Oxygen Therapy: Patient Spontanous Breathing  Post-op Assessment: Report given to RN and Post -op Vital signs reviewed and stable  Post vital signs: Reviewed and stable  Last Vitals:  Vitals Value Taken Time  BP 106/90 09/17/21 0849  Temp    Pulse 65 09/17/21 0849  Resp 17 09/17/21 0849  SpO2 97 % 09/17/21 0849    Last Pain:  Vitals:   09/17/21 0847  TempSrc:   PainSc: 0-No pain         Complications: No notable events documented.

## 2021-09-18 LAB — SURGICAL PATHOLOGY

## 2021-09-24 ENCOUNTER — Ambulatory Visit: Payer: PPO | Admitting: Nurse Practitioner

## 2021-10-12 ENCOUNTER — Encounter: Payer: Self-pay | Admitting: Gastroenterology

## 2022-01-21 DIAGNOSIS — G5603 Carpal tunnel syndrome, bilateral upper limbs: Secondary | ICD-10-CM | POA: Diagnosis not present

## 2022-01-21 DIAGNOSIS — G959 Disease of spinal cord, unspecified: Secondary | ICD-10-CM | POA: Diagnosis not present

## 2022-01-28 DIAGNOSIS — G959 Disease of spinal cord, unspecified: Secondary | ICD-10-CM | POA: Diagnosis not present

## 2022-01-28 DIAGNOSIS — R202 Paresthesia of skin: Secondary | ICD-10-CM | POA: Diagnosis not present

## 2022-01-28 DIAGNOSIS — R200 Anesthesia of skin: Secondary | ICD-10-CM | POA: Diagnosis not present

## 2022-02-03 DIAGNOSIS — R200 Anesthesia of skin: Secondary | ICD-10-CM | POA: Diagnosis not present

## 2022-02-03 DIAGNOSIS — G5603 Carpal tunnel syndrome, bilateral upper limbs: Secondary | ICD-10-CM | POA: Diagnosis not present

## 2022-02-04 ENCOUNTER — Telehealth: Payer: Self-pay

## 2022-02-04 ENCOUNTER — Encounter: Payer: Self-pay | Admitting: Neurology

## 2022-02-04 DIAGNOSIS — R202 Paresthesia of skin: Secondary | ICD-10-CM

## 2022-02-04 NOTE — Telephone Encounter (Signed)
-----   Message from Virl Son sent at 02/04/2022 11:43 AM EST ----- Regarding: emg Bilat upper  Paul Dodson

## 2022-03-02 ENCOUNTER — Ambulatory Visit: Payer: PPO | Admitting: Neurology

## 2022-03-02 DIAGNOSIS — R202 Paresthesia of skin: Secondary | ICD-10-CM

## 2022-03-02 DIAGNOSIS — G5622 Lesion of ulnar nerve, left upper limb: Secondary | ICD-10-CM

## 2022-03-02 DIAGNOSIS — G5601 Carpal tunnel syndrome, right upper limb: Secondary | ICD-10-CM

## 2022-03-02 NOTE — Procedures (Signed)
[facility] Neurology  [place], [place]  [place], [place] [address] Tel: [phone] Fax: [phone] Test Date:  03/02/2022  Patient: Paul Dodson DOB: 12/13/1955 Physician: Narda Amber, DO  Sex: Male Height: '6\' 2"'$  Ref Phys: Kary Kos, MD  ID#: [id number]   Technician:    History: This is a 66 year old man with history of left CTS release referred for evaluation of bilateral hand paresthesias.  NCV & EMG Findings: Right median and left ulnar sensory responses show prolonged latency (R4.2, L3.4 ms) and reduced amplitude (R6.8, L3.5 V).  Left median, right ulnar, and bilateral radial sensory responses are within normal limits. Bilateral median and right ulnar motor responses are within normal limits.  Left ulnar motor response shows slowed conduction velocity across the elbow (A Elbow-B Elbow, 42 m/s).   Chronic motor axonal loss changes are seen affecting the right abductor pollicis brevis muscle, without accompanying active denervation.    Impression: Right median neuropathy at or distal to the wrist, consistent with a clinical diagnosis of carpal tunnel syndrome.  Overall, these findings are moderate in degree electrically. Left ulnar neuropathy with slowing across the elbow, with demyelinating and axonal features, moderate. There is no evidence of a large fiber sensorimotor polyneuropathy or cervical radiculopathy affecting the upper extremities.   ___________________________ Narda Amber, DO    Nerve Conduction Studies   Stim Site NR Peak (ms) Norm Peak (ms) O-P Amp (V) Norm O-P Amp  Left Median Anti Sensory (2nd Digit)  33 C  Wrist    3.4 <3.8 12.3 >10  Right Median Anti Sensory (2nd Digit)  33 C  Wrist    *4.2 <3.8 *6.8 >10  Left Radial Anti Sensory (Base 1st Digit)  33 C  Wrist    2.6 <2.8 11.5 >10  Right Radial Anti Sensory (Base 1st Digit)  33 C  Wrist    2.6 <2.8 12.8 >10  Left Ulnar Anti Sensory (5th Digit)  33 C  Wrist    *3.4 <3.2 *3.5  >5  Right Ulnar Anti Sensory (5th Digit)  33 C  Wrist    3.2 <3.2 8.8 >5     Stim Site NR Onset (ms) Norm Onset (ms) O-P Amp (mV) Norm O-P Amp Site1 Site2 Delta-0 (ms) Dist (cm) Vel (m/s) Norm Vel (m/s)  Left Median Motor (Abd Poll Brev)  33 C  Wrist    3.5 <4.0 9.7 >5 Elbow Wrist 6.5 33.0 51 >50  Elbow    10.0  9.2         Right Median Motor (Abd Poll Brev)  33 C  Wrist    4.0 <4.0 8.0 >5 Elbow Wrist 6.2 32.0 52 >50  Elbow    10.2  7.9         Left Ulnar Motor (Abd Dig Minimi)  33 C  Wrist    2.5 <3.1 9.4 >7 B Elbow Wrist 4.1 25.0 61 >50  B Elbow    6.6  8.2  A Elbow B Elbow 2.4 10.0 *42 >50  A Elbow    9.0  7.3         Right Ulnar Motor (Abd Dig Minimi)  33 C  Wrist    2.4 <3.1 11.1 >7 B Elbow Wrist 4.3 24.0 56 >50  B Elbow    6.7  10.3  A Elbow B Elbow 1.7 10.0 59 >50  A Elbow    8.4  10.1          Electromyography   Side  Muscle Ins.Act Fibs Fasc Recrt Amp Dur Poly Activation Comment  Right 1stDorInt Nml Nml Nml Nml Nml Nml Nml Nml N/A  Right Abd Poll Brev Nml Nml Nml *1- *1+ *1+ *1+ Nml N/A  Right PronatorTeres Nml Nml Nml Nml Nml Nml Nml Nml N/A  Right Biceps Nml Nml Nml Nml Nml Nml Nml Nml N/A  Right Triceps Nml Nml Nml Nml Nml Nml Nml Nml N/A  Right Deltoid Nml Nml Nml Nml Nml Nml Nml Nml N/A  Left 1stDorInt Nml Nml Nml Nml Nml Nml Nml Nml N/A  Left PronatorTeres Nml Nml Nml Nml Nml Nml Nml Nml N/A  Left Biceps Nml Nml Nml Nml Nml Nml Nml Nml N/A  Left Triceps Nml Nml Nml Nml Nml Nml Nml Nml N/A  Left Deltoid Nml Nml Nml Nml Nml Nml Nml Nml N/A  Left Abd Dig Min Nml Nml Nml Nml Nml Nml Nml Nml N/A  Left FlexCarpiUln Nml Nml Nml Nml Nml Nml Nml Nml N/A      Waveforms:

## 2022-07-27 ENCOUNTER — Telehealth: Payer: Self-pay | Admitting: Nurse Practitioner

## 2022-07-27 NOTE — Telephone Encounter (Signed)
Contacted Cutter Crusan to schedule their annual wellness visit. Appointment made for 09/02/2022.  [facility] Care Guide [facility] AWV TEAM Direct Dial: [phone]

## 2022-09-02 ENCOUNTER — Ambulatory Visit (INDEPENDENT_AMBULATORY_CARE_PROVIDER_SITE_OTHER): Payer: PPO

## 2022-09-02 VITALS — Ht 75.0 in | Wt 222.0 lb

## 2022-09-02 DIAGNOSIS — Z0000 Encounter for general adult medical examination without abnormal findings: Secondary | ICD-10-CM | POA: Diagnosis not present

## 2022-09-02 NOTE — Patient Instructions (Signed)
[name] , Thank you for taking time to come for your Medicare Wellness Visit. I appreciate your ongoing commitment to your health goals. Please review the following plan we discussed and let me know if I can assist you in the future.   These are the goals we discussed:  Goals      Patient Stated     Lose 17 pounds.        This is a list of the screening recommended for you and due dates:  Health Maintenance  Topic Date Due   Hepatitis C Screening  Never done   Zoster (Shingles) Vaccine (1 of 2) Never done   Pneumonia Vaccine (1 of 1 - PCV) Never done   Colon Cancer Screening  09/18/2022   Flu Shot  10/21/2022   Medicare Annual Wellness Visit  09/02/2023   DTaP/Tdap/Td vaccine (2 - Td or Tdap) 05/16/2024   HPV Vaccine  Aged Out   COVID-19 Vaccine  Discontinued    Advanced directives: Please bring a copy of your health care power of attorney and living will to the office to be added to your chart at your convenience.   Conditions/risks identified: Aim for 30 minutes of exercise or brisk walking, 6-8 glasses of water, and 5 servings of fruits and vegetables each day.   Next appointment: Follow up in one year for your annual wellness visit. 09/06/23 @ 1:30 televisit  Preventive Care 67 Years and Older, Male  Preventive care refers to lifestyle choices and visits with your health care provider that can promote health and wellness. What does preventive care include? A yearly physical exam. This is also called an annual well check. Dental exams once or twice a year. Routine eye exams. Ask your health care provider how often you should have your eyes checked. Personal lifestyle choices, including: Daily care of your teeth and gums. Regular physical activity. Eating a healthy diet. Avoiding tobacco and drug use. Limiting alcohol use. Practicing safe sex. Taking low doses of aspirin every day. Taking vitamin and mineral supplements as recommended by your health care  provider. What happens during an annual well check? The services and screenings done by your health care provider during your annual well check will depend on your age, overall health, lifestyle risk factors, and family history of disease. Counseling  Your health care provider may ask you questions about your: Alcohol use. Tobacco use. Drug use. Emotional well-being. Home and relationship well-being. Sexual activity. Eating habits. History of falls. Memory and ability to understand (cognition). Work and work Astronomer. Screening  You may have the following tests or measurements: Height, weight, and BMI. Blood pressure. Lipid and cholesterol levels. These may be checked every 5 years, or more frequently if you are over 67 years old. Skin check. Lung cancer screening. You may have this screening every year starting at age 4 if you have a 30-pack-year history of smoking and currently smoke or have quit within the past 15 years. Fecal occult blood test (FOBT) of the stool. You may have this test every year starting at age 67. Flexible sigmoidoscopy or colonoscopy. You may have a sigmoidoscopy every 5 years or a colonoscopy every 10 years starting at age 67. Prostate cancer screening. Recommendations will vary depending on your family history and other risks. Hepatitis C blood test. Hepatitis B blood test. Sexually transmitted disease (STD) testing. Diabetes screening. This is done by checking your blood sugar (glucose) after you have not eaten for a while (fasting). You may have  this done every 1-3 years. Abdominal aortic aneurysm (AAA) screening. You may need this if you are a current or former smoker. Osteoporosis. You may be screened starting at age 67 if you are at high risk. Talk with your health care provider about your test results, treatment options, and if necessary, the need for more tests. Vaccines  Your health care provider may recommend certain vaccines, such  as: Influenza vaccine. This is recommended every year. Tetanus, diphtheria, and acellular pertussis (Tdap, Td) vaccine. You may need a Td booster every 10 years. Zoster vaccine. You may need this after age 16. Pneumococcal 13-valent conjugate (PCV13) vaccine. One dose is recommended after age 67. Pneumococcal polysaccharide (PPSV23) vaccine. One dose is recommended after age 67. Talk to your health care provider about which screenings and vaccines you need and how often you need them. This information is not intended to replace advice given to you by your health care provider. Make sure you discuss any questions you have with your health care provider. Document Released: 04/04/2015 Document Revised: 11/26/2015 Document Reviewed: 01/07/2015 Elsevier Interactive Patient Education  2017 [employer] Prevention in the Home Falls can cause injuries. They can happen to people of all ages. There are many things you can do to make your home safe and to help prevent falls. What can I do on the outside of my home? Regularly fix the edges of walkways and driveways and fix any cracks. Remove anything that might make you trip as you walk through a door, such as a raised step or threshold. Trim any bushes or trees on the path to your home. Use bright outdoor lighting. Clear any walking paths of anything that might make someone trip, such as rocks or tools. Regularly check to see if handrails are loose or broken. Make sure that both sides of any steps have handrails. Any raised decks and porches should have guardrails on the edges. Have any leaves, snow, or ice cleared regularly. Use sand or salt on walking paths during winter. Clean up any spills in your garage right away. This includes oil or grease spills. What can I do in the bathroom? Use night lights. Install grab bars by the toilet and in the tub and shower. Do not use towel bars as grab bars. Use non-skid mats or decals in the tub or  shower. If you need to sit down in the shower, use a plastic, non-slip stool. Keep the floor dry. Clean up any water that spills on the floor as soon as it happens. Remove soap buildup in the tub or shower regularly. Attach bath mats securely with double-sided non-slip rug tape. Do not have throw rugs and other things on the floor that can make you trip. What can I do in the bedroom? Use night lights. Make sure that you have a light by your bed that is easy to reach. Do not use any sheets or blankets that are too big for your bed. They should not hang down onto the floor. Have a firm chair that has side arms. You can use this for support while you get dressed. Do not have throw rugs and other things on the floor that can make you trip. What can I do in the kitchen? Clean up any spills right away. Avoid walking on wet floors. Keep items that you use a lot in easy-to-reach places. If you need to reach something above you, use a strong step stool that has a grab bar. Keep electrical cords out  of the way. Do not use floor polish or wax that makes floors slippery. If you must use wax, use non-skid floor wax. Do not have throw rugs and other things on the floor that can make you trip. What can I do with my stairs? Do not leave any items on the stairs. Make sure that there are handrails on both sides of the stairs and use them. Fix handrails that are broken or loose. Make sure that handrails are as long as the stairways. Check any carpeting to make sure that it is firmly attached to the stairs. Fix any carpet that is loose or worn. Avoid having throw rugs at the top or bottom of the stairs. If you do have throw rugs, attach them to the floor with carpet tape. Make sure that you have a light switch at the top of the stairs and the bottom of the stairs. If you do not have them, ask someone to add them for you. What else can I do to help prevent falls? Wear shoes that: Do not have high heels. Have  rubber bottoms. Are comfortable and fit you well. Are closed at the toe. Do not wear sandals. If you use a stepladder: Make sure that it is fully opened. Do not climb a closed stepladder. Make sure that both sides of the stepladder are locked into place. Ask someone to hold it for you, if possible. Clearly mark and make sure that you can see: Any grab bars or handrails. First and last steps. Where the edge of each step is. Use tools that help you move around (mobility aids) if they are needed. These include: Canes. Walkers. Scooters. Crutches. Turn on the lights when you go into a dark area. Replace any light bulbs as soon as they burn out. Set up your furniture so you have a clear path. Avoid moving your furniture around. If any of your floors are uneven, fix them. If there are any pets around you, be aware of where they are. Review your medicines with your doctor. Some medicines can make you feel dizzy. This can increase your chance of falling. Ask your doctor what other things that you can do to help prevent falls. This information is not intended to replace advice given to you by your health care provider. Make sure you discuss any questions you have with your health care provider. Document Released: 01/02/2009 Document Revised: 08/14/2015 Document Reviewed: 04/12/2014 Elsevier Interactive Patient Education  2017 [employer].

## 2022-09-02 NOTE — Progress Notes (Signed)
I connected with  Paul Dodson on 09/02/22 by a audio enabled telemedicine application and verified that I am speaking with the correct person using two identifiers.  Patient Location: Home  Provider Location: Home Office  I discussed the limitations of evaluation and management by telemedicine. The patient expressed understanding and agreed to proceed.  Subjective:   Paul Dodson is a 67 y.o. male who presents for Medicare Annual/Subsequent preventive examination.  Review of Systems      Cardiac Risk Factors include: advanced age (>78men, >65 women);obesity (BMI >30kg/m2);sedentary lifestyle;male gender     Objective:    Today's Vitals   09/02/22 1502  Weight: 222 lb (100.7 kg)  Height: 6\' 3"  (1.905 m)   Body mass index is 27.75 kg/m.     09/02/2022    3:13 PM 09/17/2021    7:11 AM  Advanced Directives  Does Patient Have a Medical Advance Directive? Yes Yes  Type of Estate agent of [place];Living will Healthcare Power of [place];Living will  Copy of Healthcare Power of Attorney in Chart? No - copy requested No - copy requested    Current Medications (verified) Outpatient Encounter Medications as of 09/02/2022  Medication Sig   Cyanocobalamin (B-12 PO) Take by mouth.   No facility-administered encounter medications on file as of 09/02/2022.    Allergies (verified) Patient has no known allergies.   History: History reviewed. No pertinent past medical history. 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   Family History  Problem Relation Age of Onset   Cancer Mother 56       breast   Hyperlipidemia Father    CAD Father        CABG   Social History   Socioeconomic History    Marital status: Married    Spouse name: Not on file   Number of children: 2   Years of education: Not on file   Highest education level: Not on file  Occupational History   Not on file  Tobacco Use   Smoking status: Never    Passive exposure: Past   Smokeless tobacco: Never  Vaping Use   Vaping Use: Never used  Substance and Sexual Activity   Alcohol use: No   Drug use: No   Sexual activity: Not on file  Other Topics Concern   Not on file  Social History Narrative   Son and daughter      Hobbies: Surveyor, minerals and camping   Social Determinants of Health   Financial Resource Strain: Low Risk  (09/02/2022)   Overall Financial Resource Strain (CARDIA)    Difficulty of Paying Living Expenses: Not hard at all  Food Insecurity: No Food Insecurity (09/02/2022)   Hunger Vital Sign    Worried About Running Out of Food in the Last Year: Never true    Ran Out of Food in the Last Year: Never true  Transportation Needs: No Transportation Needs (09/02/2022)   PRAPARE - Administrator, Civil Service (Medical): No    Lack of Transportation (Non-Medical): No  Physical Activity: Insufficiently Active (09/02/2022)   Exercise Vital Sign    Days of Exercise per Week: 5 days    Minutes of Exercise per Session: 20 min  Stress:  No Stress Concern Present (09/02/2022)   [employer] of Occupational Health - Occupational Stress Questionnaire    Feeling of Stress : Not at all  Social Connections: Moderately Integrated (09/02/2022)   Social Connection and Isolation Panel [NHANES]    Frequency of Communication with Friends and Family: More than three times a week    Frequency of Social Gatherings with Friends and Family: More than three times a week    Attends Religious Services: More than 4 times per year    Active Member of [employer] or Organizations: No    Attends Engineer, structural: Never    Marital Status: Married    Tobacco Counseling Counseling given: Not  Answered   Clinical Intake:  Pre-visit preparation completed: Yes  Pain : No/denies pain     Nutritional Risks: None Diabetes: No  How often do you need to have someone help you when you read instructions, pamphlets, or other written materials from your doctor or pharmacy?: 1 - Never  Diabetic? no  Interpreter Needed?: No  Information entered by :: C.Oluwaferanmi Wain LPN   Activities of Daily Living    09/02/2022    3:13 PM  In your present state of health, do you have any difficulty performing the following activities:  Hearing? 0  Vision? 0  Difficulty concentrating or making decisions? 0  Walking or climbing stairs? 0  Dressing or bathing? 0  Doing errands, shopping? 0  Preparing Food and eating ? N  Using the Toilet? N  In the past six months, have you accidently leaked urine? N  Do you have problems with loss of bowel control? N  Managing your Medications? N  Managing your Finances? N  Housekeeping or managing your Housekeeping? N    Patient Care Team: Eden Emms, NP as PCP - General (Nurse Practitioner)  Indicate any recent Medical Services you may have received from other than [facility] providers in the past year (date may be approximate).     Assessment:   This is a routine wellness examination for Paul Dodson.  Hearing/Vision screen Hearing Screening - Comments:: No hearing issues Vision Screening - Comments:: Contacts - [facility]  Dietary issues and exercise activities discussed: Current Exercise Habits: Home exercise routine, Type of exercise: walking, Time (Minutes): 20, Frequency (Times/Week): 5, Weekly Exercise (Minutes/Week): 100, Intensity: Mild, Exercise limited by: None identified   Goals Addressed             This Visit's Progress    Patient Stated       Lose 17 pounds.       Depression Screen    09/02/2022    3:11 PM 08/24/2021    8:23 AM  PHQ 2/9 Scores  PHQ - 2 Score 0 0    Fall Risk    09/02/2022    3:07 PM  Fall Risk    Falls in the past year? 1  Number falls in past yr: 0  Injury with Fall? 0  Risk for fall due to : No Fall Risks  Follow up Falls prevention discussed;Falls evaluation completed    FALL RISK PREVENTION PERTAINING TO THE HOME:  Any stairs in or around the home? Yes  If so, are there any without handrails? No  Home free of loose throw rugs in walkways, pet beds, electrical cords, etc? Yes  Adequate lighting in your home to reduce risk of falls? Yes   ASSISTIVE DEVICES UTILIZED TO PREVENT FALLS:  Life alert? No  Use of a cane, walker or  w/c? No  Grab bars in the bathroom? Yes  Shower chair or bench in shower? Yes  Elevated toilet seat or a handicapped toilet? Yes    Cognitive Function:        09/02/2022    3:15 PM  6CIT Screen  What Year? 0 points  What month? 0 points  What time? 0 points  Count back from 20 0 points  Months in reverse 0 points  Repeat phrase 0 points  Total Score 0 points    Immunizations Immunization History  Administered Date(s) Administered   Tdap 05/16/2014    TDAP status: Up to date  Flu Vaccine status: Up to date  Pneumococcal vaccine status: Declined,  Education has been provided regarding the importance of this vaccine but patient still declined. Advised may receive this vaccine at local pharmacy or Health Dept. Aware to provide a copy of the vaccination record if obtained from local pharmacy or Health Dept. Verbalized acceptance and understanding.   Covid-19 vaccine status: Declined, Education has been provided regarding the importance of this vaccine but patient still declined. Advised may receive this vaccine at local pharmacy or Health Dept.or vaccine clinic. Aware to provide a copy of the vaccination record if obtained from local pharmacy or Health Dept. Verbalized acceptance and understanding.  Qualifies for Shingles Vaccine? Yes   Zostavax completed No   Shingrix Completed?: No.    Education has been provided regarding the  importance of this vaccine. Patient has been advised to call insurance company to determine out of pocket expense if they have not yet received this vaccine. Advised may also receive vaccine at local pharmacy or Health Dept. Verbalized acceptance and understanding.  Screening Tests Health Maintenance  Topic Date Due   Hepatitis C Screening  Never done   Zoster Vaccines- Shingrix (1 of 2) Never done   Pneumonia Vaccine 45+ Years old (1 of 1 - PCV) Never done   Colonoscopy  09/18/2022   INFLUENZA VACCINE  10/21/2022   Medicare Annual Wellness (AWV)  09/02/2023   DTaP/Tdap/Td (2 - Td or Tdap) 05/16/2024   HPV VACCINES  Aged Out   COVID-19 Vaccine  Discontinued    Health Maintenance  Health Maintenance Due  Topic Date Due   Hepatitis C Screening  Never done   Zoster Vaccines- Shingrix (1 of 2) Never done   Pneumonia Vaccine 48+ Years old (1 of 1 - PCV) Never done   Colonoscopy  09/18/2022    Colorectal cancer screening: Type of screening: Colonoscopy. Completed 09/17/21. Repeat every 1 years Declines colonoscopy.  Lung Cancer Screening: (Low Dose CT Chest recommended if Age 15-80 years, 30 pack-year currently smoking OR have quit w/in 15years.) does not qualify.   Lung Cancer Screening Referral: no  Additional Screening:  Hepatitis C Screening: does qualify; DUE AT NEXT VISIT  Vision Screening: Recommended annual ophthalmology exams for early detection of glaucoma and other disorders of the eye. Is the patient up to date with their annual eye exam?  Yes  Who is the provider or what is the name of the office in which the patient attends annual eye exams? [facility] If pt is not established with a provider, would they like to be referred to a provider to establish care? Yes .   Dental Screening: Recommended annual dental exams for proper oral hygiene  Community Resource Referral / Chronic Care Management: CRR required this visit?  No   CCM required this visit?  No       Plan:  I have personally reviewed and noted the following in the patient's chart:   Medical and social history Use of alcohol, tobacco or illicit drugs  Current medications and supplements including opioid prescriptions. Patient is not currently taking opioid prescriptions. Functional ability and status Nutritional status Physical activity Advanced directives List of other physicians Hospitalizations, surgeries, and ER visits in previous 12 months Vitals Screenings to include cognitive, depression, and falls Referrals and appointments  In addition, I have reviewed and discussed with patient certain preventive protocols, quality metrics, and best practice recommendations. A written personalized care plan for preventive services as well as general preventive health recommendations were provided to patient.     Maryan Puls, LPN   1/61/0960   Nurse Notes: Vaccinations: declines all Influenza vaccine: recommend every Fall Pneumococcal vaccine: recommend once per lifetime Prevnar-20 Tdap vaccine: recommend every 10 years Shingles vaccine: recommend Shingrix which is 2 doses 2-6 months apart and over 90% effective     Covid-19: recommend 2 doses one month apart with a booster 6 months later

## 2022-10-04 ENCOUNTER — Encounter: Payer: Self-pay | Admitting: Neurology

## 2022-10-04 ENCOUNTER — Ambulatory Visit: Payer: PPO | Admitting: Neurology

## 2022-10-04 VITALS — BP 156/100 | HR 80 | Ht 75.0 in | Wt 233.0 lb

## 2022-10-04 DIAGNOSIS — G5601 Carpal tunnel syndrome, right upper limb: Secondary | ICD-10-CM

## 2022-10-04 DIAGNOSIS — G5622 Lesion of ulnar nerve, left upper limb: Secondary | ICD-10-CM

## 2022-10-04 MED ORDER — GABAPENTIN 300 MG PO CAPS: 300.0000 mg | ORAL_CAPSULE | Freq: Every day | ORAL | 3 refills | Status: DC

## 2022-10-04 NOTE — Patient Instructions (Addendum)
You can try wrist brace and soft elbow pad  You can try gabapentin 300mg  at bedtime  Return to clinic 3 months

## 2022-10-04 NOTE — Progress Notes (Signed)
[facility] Neurology Division Clinic Note - Initial Visit   Date: 10/04/2022   Paul Dodson MRN: [id number] DOB: 01-Oct-1955   Dear Dr. Wynetta Emery:  Thank you for your kind referral of Paul Dodson for consultation of bilateral hand tingling. Although his history is well known to you, please allow [place] to reiterate it for the purpose of our medical record. The patient was accompanied to the clinic by wife who also provides collateral information.     Paul Dodson is a 67 y.o. right-handed male with left CTS release presenting for evaluation of bilateral hand tingling.   IMPRESSION/PLAN: Right carpal tunnel syndrome, moderate Left ulnar neuropathy at the elbow, moderate  Results of EMG were discussed with patient.  I also reviewed management strategies and suggested using a wrist brace and soft elbow pad to prevent nerve compression.  For paresthesias, start gabapentin 300mg  at bedtime.  This can be titrated as needed. If he fails conservative therapy, surgery is an option.  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 Danielle Dess.  He did not appreciate any benefit.   He continues to have numbness from the wrist into the hands.  It is worse with activity.  He does not have any weakness in the hands.  He then saw Dr. Wynetta Emery who ordered MRI cervical spine which did not show compressive pathology in the cervical spine.  He also ordered EMG which showed right CTS (moderate) and left ulnar neuropathy at the elbow (moderate).  There was no evidence of neuropathy.   Out-side paper records, electronic medical record, and images have been reviewed where available and summarized as:  NCS/EMG of the arms 03/02/2022: Right median neuropathy at or distal to the wrist, consistent with a clinical diagnosis of carpal tunnel syndrome.   Overall, these findings are moderate in degree electrically. Left ulnar neuropathy with slowing across the elbow, with demyelinating and axonal features, moderate. There is no evidence of a large fiber sensorimotor polyneuropathy or cervical radiculopathy affecting the upper extremities.  MRI cervical spine 01/28/2022: Multilevel spondylosis of the cervical spine as described 2. Moderate left and mild right foraminal stenosis at C3-4. Moderate foraminla stenosis bilaterally C7-T1 worse at the left Mild uncovertebral sturring bilaterally C5-6 without significant stenosis  Lab Results  Component Value Date   HGBA1C 6.1 08/25/2021    History reviewed. No pertinent past medical history.  Past Surgical History:  Procedure Laterality Date   CARPAL TUNNEL RELEASE Left    COLONOSCOPY WITH PROPOFOL N/A 09/17/2021   Procedure: COLONOSCOPY WITH PROPOFOL;  Surgeon: Wyline Mood, MD;  Location: [facility] ENDOSCOPY;  Service: Gastroenterology;  Laterality: N/A;   CORNEAL TRANSPLANT Bilateral    in his 58s     Medications:  Outpatient Encounter Medications as of 10/04/2022  Medication Sig   [DISCONTINUED] Cyanocobalamin (B-12 PO) Take by mouth.   No facility-administered encounter medications on file as of 10/04/2022.    Allergies: No Known Allergies  Family History: Family History  Problem Relation Age of Onset   Cancer Mother 46       breast   Hyperlipidemia Father    CAD Father        CABG    Social History: Social History   Tobacco Use   Smoking status: Never    Passive exposure: Past   Smokeless tobacco: Never  Vaping Use   Vaping status: Never Used  Substance Use Topics   Alcohol use: No   Drug use: No   Social History   Social History Narrative   Son and daughter      Hobbies: Surveyor, minerals and camping      Are you right handed or left handed? Right Handed    Are you currently employed ? Semi-retired   What is your current occupation?   Do you live at home alone? No    Who lives with you? wife   What type of home do you live in: 1 story or 2 story? Lives in one story home        Vital Signs:  BP (!) 156/100   Pulse 80   Ht 6\' 3"  (1.905 m)   Wt 233 lb (105.7 kg)   SpO2 97%   BMI 29.12 kg/m     Neurological Exam: MENTAL STATUS including orientation to time, place, person, recent and remote memory, attention span and concentration, language, and fund of knowledge is normal.  Speech is not dysarthric.  CRANIAL NERVES: II:  No visual field defects.     III-IV-VI: Pupils equal round and reactive to light.  Normal conjugate, extra-ocular eye movements in all directions of gaze.  No nystagmus.  No ptosis.   V:  Normal facial sensation.    VII:  Normal facial symmetry and movements.   VIII:  Normal hearing and vestibular function.   IX-X:  Normal palatal movement.   XI:  Normal shoulder shrug and head rotation.   XII:  Normal tongue strength and range of motion, no deviation or fasciculation.  MOTOR:  No atrophy, fasciculations or abnormal movements.  No pronator drift.   Upper Extremity:  Right  Left  Deltoid  5/5   5/5   Biceps  5/5   5/5   Triceps  5/5   5/5   Wrist extensors  5/5   5/5   Wrist flexors  5/5   5/5   Finger extensors  5/5   5/5   Finger flexors  5/5   5/5   Dorsal interossei  5/5   5/5   Abductor pollicis  5/5   5/5   Tone (Ashworth scale)  0  0   Lower Extremity:  Right  Left  Hip flexors  5/5   5/5   Knee flexors  5/5   5/5   Knee extensors  5/5   5/5   Dorsiflexors  5/5   5/5   Plantarflexors  5/5   5/5   Toe extensors  5/5   5/5   Toe flexors  5/5   5/5   Tone (Ashworth scale)  0  0   MSRs:                                           Right        Left brachioradialis 2+  2+  biceps 2+  2+  triceps 2+  2+  patellar 2+  2+  ankle jerk 2+  2+  Hoffman no  no  plantar response down  down   SENSORY:  Normal and symmetric perception of light touch, pinprick, vibration, and proprioception.  Tinel's sign at the wrist  and elbow negative bilaterally.   COORDINATION/GAIT: Normal finger-to- nose-finger.  Intact rapid alternating movements bilaterally.  Able to rise from a chair without using arms.  Gait narrow based and  stable. Tandem and stressed gait intact.   Total time spent reviewing records, interview, history/exam, documentation, and coordination of care on day of encounter:  40 min    Thank you for allowing me to participate in patient's care.  If I can answer any additional questions, I would be pleased to do so.    Sincerely,    Dex Blakely K. Allena Katz, DO

## 2022-10-11 ENCOUNTER — Telehealth: Payer: Self-pay

## 2022-10-26 ENCOUNTER — Ambulatory Visit: Payer: PPO | Admitting: Neurology

## 2022-10-30 ENCOUNTER — Telehealth: Payer: PPO | Admitting: Family Medicine

## 2022-10-30 DIAGNOSIS — J069 Acute upper respiratory infection, unspecified: Secondary | ICD-10-CM

## 2022-10-30 MED ORDER — PREDNISONE 10 MG (21) PO TBPK: ORAL_TABLET | ORAL | 0 refills | Status: DC

## 2022-10-30 NOTE — Patient Instructions (Signed)
Lorelee New, thank you for joining Reed Pandy, PA-C for today's virtual visit.  While this provider is not your primary care provider (PCP), if your PCP is located in our provider database this encounter information will be shared with them immediately following your visit.   A [facility] MyChart account gives you access to today's visit and all your visits, tests, and labs performed at [facility] " click here if you don't have a [facility] MyChart account or go to [email or web address]  Consent: (Patient) Paul Dodson provided verbal consent for this virtual visit at the beginning of the encounter.  Current Medications:  Current Outpatient Medications:    predniSONE (STERAPRED UNI-PAK 21 TAB) 10 MG (21) TBPK tablet, Take following package directions., Disp: 21 tablet, Rfl: 0   gabapentin (NEURONTIN) 300 MG capsule, Take 1 capsule (300 mg total) by mouth at bedtime., Disp: 30 capsule, Rfl: 3   Medications ordered in this encounter:  Meds ordered this encounter  Medications   predniSONE (STERAPRED UNI-PAK 21 TAB) 10 MG (21) TBPK tablet    Sig: Take following package directions.    Dispense:  21 tablet    Refill:  0    Please dispense one standard blister pack taper.     *If you need refills on other medications prior to your next appointment, please contact your pharmacy*  Follow-Up: Call back or seek an in-person evaluation if the symptoms worsen or if the condition fails to improve as anticipated.  [facility] [phone]  Other Instructions Upper Respiratory Infection, Adult An upper respiratory infection (URI) is a common viral infection of the nose, throat, and upper air passages that lead to the lungs. The most common type of URI is the common cold. URIs usually get better on their own, without medical treatment. What are the causes? A URI is caused by a virus. You may catch a virus by: Breathing in droplets from an infected person's  cough or sneeze. Touching something that has been exposed to the virus (is contaminated) and then touching your mouth, nose, or eyes. What increases the risk? You are more likely to get a URI if: You are very young or very old. You have close contact with others, such as at work, school, or a health care facility. You smoke. You have long-term (chronic) heart or lung disease. You have a weakened disease-fighting system (immune system). You have nasal allergies or asthma. You are experiencing a lot of stress. You have poor nutrition. What are the signs or symptoms? A URI usually involves some of the following symptoms: Runny or stuffy (congested) nose. Cough. Sneezing. Sore throat. Headache. Fatigue. Fever. Loss of appetite. Pain in your forehead, behind your eyes, and over your cheekbones (sinus pain). Muscle aches. Redness or irritation of the eyes. Pressure in the ears or face. How is this diagnosed? This condition may be diagnosed based on your medical history and symptoms, and a physical exam. Your health care provider may use a swab to take a mucus sample from your nose (nasal swab). This sample can be tested to determine what virus is causing the illness. How is this treated? URIs usually get better on their own within 7-10 days. Medicines cannot cure URIs, but your health care provider may recommend certain medicines to help relieve symptoms, such as: Over-the-counter cold medicines. Cough suppressants. Coughing is a type of defense against infection that helps to clear the respiratory system, so take these medicines only as recommended by your health care  provider. Fever-reducing medicines. Follow these instructions at home: Activity Rest as needed. If you have a fever, stay home from work or school until your fever is gone or until your health care provider says your URI cannot spread to other people (is no longer contagious). Your health care provider may have you wear a  face mask to prevent your infection from spreading. Relieving symptoms Gargle with a mixture of salt and water 3-4 times a day or as needed. To make salt water, completely dissolve -1 tsp (3-6 g) of salt in 1 cup (237 mL) of warm water. Use a cool-mist humidifier to add moisture to the air. This can help you breathe more easily. Eating and drinking  Drink enough fluid to keep your urine pale yellow. Eat soups and other clear broths. General instructions  Take over-the-counter and prescription medicines only as told by your health care provider. These include cold medicines, fever reducers, and cough suppressants. Do not use any products that contain nicotine or tobacco. These products include cigarettes, chewing tobacco, and vaping devices, such as e-cigarettes. If you need help quitting, ask your health care provider. Stay away from secondhand smoke. Stay up to date on all immunizations, including the yearly (annual) flu vaccine. Keep all follow-up visits. This is important. How to prevent the spread of infection to others URIs can be contagious. To prevent the infection from spreading: Wash your hands with soap and water for at least 20 seconds. If soap and water are not available, use hand sanitizer. Avoid touching your mouth, face, eyes, or nose. Cough or sneeze into a tissue or your sleeve or elbow instead of into your hand or into the air.  Contact a health care provider if: You are getting worse instead of better. You have a fever or chills. Your mucus is brown or red. You have yellow or brown discharge coming from your nose. You have pain in your face, especially when you bend forward. You have swollen neck glands. You have pain while swallowing. You have white areas in the back of your throat. Get help right away if: You have shortness of breath that gets worse. You have severe or persistent: Headache. Ear pain. Sinus pain. Chest pain. You have chronic lung disease  along with any of the following: Making high-pitched whistling sounds when you breathe, most often when you breathe out (wheezing). Prolonged cough (more than 14 days). Coughing up blood. A change in your usual mucus. You have a stiff neck. You have changes in your: Vision. Hearing. Thinking. Mood. These symptoms may be an emergency. Get help right away. Call 911. Do not wait to see if the symptoms will go away. Do not drive yourself to the hospital. Summary An upper respiratory infection (URI) is a common infection of the nose, throat, and upper air passages that lead to the lungs. A URI is caused by a virus. URIs usually get better on their own within 7-10 days. Medicines cannot cure URIs, but your health care provider may recommend certain medicines to help relieve symptoms. This information is not intended to replace advice given to you by your health care provider. Make sure you discuss any questions you have with your health care provider. Document Revised: 10/08/2020 Document Reviewed: 10/08/2020 Elsevier Patient Education  2024 Elsevier Inc.    If you have been instructed to have an in-person evaluation today at a local Urgent Care facility, please use the link below. It will take you to a list of all of our  available [facility], including address, phone number and hours of operation. Please do not delay care.  [facility]  If you or a family member do not have a primary care provider, use the link below to schedule a visit and establish care. When you choose a [facility] primary care physician or advanced practice provider, you gain a long-term partner in health. Find a Primary Care Provider  Learn more about [facility] in-office and virtual care options: [facility] - Get Care Now

## 2022-10-30 NOTE — Progress Notes (Signed)
Virtual Visit Consent   Paul Dodson, you are scheduled for a virtual visit with a [facility] provider today. Just as with appointments in the office, your consent must be obtained to participate. Your consent will be active for this visit and any virtual visit you may have with one of our providers in the next 365 days. If you have a MyChart account, a copy of this consent can be sent to you electronically.  As this is a virtual visit, video technology does not allow for your provider to perform a traditional examination. This may limit your provider's ability to fully assess your condition. If your provider identifies any concerns that need to be evaluated in person or the need to arrange testing (such as labs, EKG, etc.), we will make arrangements to do so. Although advances in technology are sophisticated, we cannot ensure that it will always work on either your end or our end. If the connection with a video visit is poor, the visit may have to be switched to a telephone visit. With either a video or telephone visit, we are not always able to ensure that we have a secure connection.  By engaging in this virtual visit, you consent to the provision of healthcare and authorize for your insurance to be billed (if applicable) for the services provided during this visit. Depending on your insurance coverage, you may receive a charge related to this service.  I need to obtain your verbal consent now. Are you willing to proceed with your visit today? Paul Dodson has provided verbal consent on 10/30/2022 for a virtual visit (video or telephone). Reed Pandy, [place]  Date: 10/30/2022 3:28 PM  Virtual Visit via Video Note   I, Reed Pandy, connected with  Paul Dodson  ([id number], 03-Feb-1956) on 10/30/22 at  3:15 PM EDT by a video-enabled telemedicine application and verified that I am speaking with the correct person using two identifiers.  Location: Patient: Virtual Visit Location Patient:  Home Provider: Virtual Visit Location Provider: Home Office   I discussed the limitations of evaluation and management by telemedicine and the availability of in person appointments. The patient expressed understanding and agreed to proceed.    History of Present Illness: Paul Dodson is a 67 y.o. who identifies as a male who was assigned male at birth, and is being seen today for c/o this week he started a cough two days ago. 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  Pt states felt hot and had a fever was up to 101.  Pt states taking some Aleve and the fever has come down and the headache went away.  Pt states he has a bad cough and has taken Nyquil and taking Promethazine. Pt denies N/V, diarrhea or chills.  Pt states feeling pressure around his eyes but is not congested.  Pt states he is having some tightness in chest from coughing. Pt did not test for COVID.   HPI: HPI  Problems:  Patient Active Problem List   Diagnosis Date Noted   Class 1 obesity due to excess calories without serious comorbidity with body mass index (BMI) of 31.0 to 31.9 in adult 08/24/2021   Elevated blood pressure reading in office without diagnosis of hypertension 08/24/2021   Preventative health care 08/24/2021    Allergies: No Known Allergies Medications:  Current Outpatient Medications:    predniSONE (STERAPRED UNI-PAK 21 TAB) 10 MG (21) TBPK tablet, Take following package directions., Disp: 21 tablet, Rfl: 0   gabapentin (NEURONTIN) 300 MG capsule,  Take 1 capsule (300 mg total) by mouth at bedtime., Disp: 30 capsule, Rfl: 3  Observations/Objective: Patient is well-developed, well-nourished in no acute distress.  Resting comfortably at home.  Head is normocephalic, atraumatic.  No labored breathing.  Speech is clear and coherent with logical content.  Patient is alert and oriented at baseline.    Assessment and Plan: 1. Upper respiratory tract infection, unspecified type - predniSONE (STERAPRED UNI-PAK  21 TAB) 10 MG (21) TBPK tablet; Take following package directions.  Dispense: 21 tablet; Refill: 0  -Pt to continue conservative measures -Discuss use of throat lozenges and humidifier -Start Prednisone taper to help with cough symptoms as he states Mucinex and Tessalon are not helpful.  -Pt to head to urgent care with worsening symptoms -Pt verbalized understanding.   Follow Up Instructions: I discussed the assessment and treatment plan with the patient. The patient was provided an opportunity to ask questions and all were answered. The patient agreed with the plan and demonstrated an understanding of the instructions.  A copy of instructions were sent to the patient via MyChart unless otherwise noted below.     The patient was advised to call back or seek an in-person evaluation if the symptoms worsen or if the condition fails to improve as anticipated.  Time:  I spent 15 minutes with the patient via telehealth technology discussing the above problems/concerns.    Reed Pandy, PA-C

## 2022-12-29 ENCOUNTER — Encounter: Payer: Self-pay | Admitting: Neurology

## 2022-12-29 ENCOUNTER — Ambulatory Visit: Payer: PPO | Admitting: Neurology

## 2022-12-29 ENCOUNTER — Encounter: Payer: Self-pay | Admitting: Physician Assistant

## 2022-12-29 ENCOUNTER — Telehealth: Payer: PPO | Admitting: Physician Assistant

## 2022-12-29 VITALS — BP 152/98 | HR 81 | Ht 75.0 in | Wt 229.0 lb

## 2022-12-29 DIAGNOSIS — G5622 Lesion of ulnar nerve, left upper limb: Secondary | ICD-10-CM

## 2022-12-29 DIAGNOSIS — R030 Elevated blood-pressure reading, without diagnosis of hypertension: Secondary | ICD-10-CM | POA: Diagnosis not present

## 2022-12-29 DIAGNOSIS — J40 Bronchitis, not specified as acute or chronic: Secondary | ICD-10-CM | POA: Diagnosis not present

## 2022-12-29 DIAGNOSIS — G5601 Carpal tunnel syndrome, right upper limb: Secondary | ICD-10-CM

## 2022-12-29 MED ORDER — ALBUTEROL SULFATE HFA 108 (90 BASE) MCG/ACT IN AERS: 2.0000 | INHALATION_SPRAY | Freq: Four times a day (QID) | RESPIRATORY_TRACT | 0 refills | Status: DC | PRN

## 2022-12-29 MED ORDER — PREDNISONE 20 MG PO TABS: 40.0000 mg | ORAL_TABLET | Freq: Every day | ORAL | 0 refills | Status: DC

## 2022-12-29 MED ORDER — BENZONATATE 100 MG PO CAPS: 100.0000 mg | ORAL_CAPSULE | Freq: Three times a day (TID) | ORAL | 0 refills | Status: DC | PRN

## 2022-12-29 MED ORDER — GABAPENTIN 300 MG PO CAPS
300.0000 mg | ORAL_CAPSULE | Freq: Every day | ORAL | 1 refills | Status: DC
Start: 2022-12-29 — End: 2023-07-04

## 2022-12-29 NOTE — Progress Notes (Signed)
Virtual Visit Consent   Paul Dodson, you are scheduled for a virtual visit with a [facility] provider today. Just as with appointments in the office, your consent must be obtained to participate. Your consent will be active for this visit and any virtual visit you may have with one of our providers in the next 365 days. If you have a MyChart account, a copy of this consent can be sent to you electronically.  As this is a virtual visit, video technology does not allow for your provider to perform a traditional examination. This may limit your provider's ability to fully assess your condition. If your provider identifies any concerns that need to be evaluated in person or the need to arrange testing (such as labs, EKG, etc.), we will make arrangements to do so. Although advances in technology are sophisticated, we cannot ensure that it will always work on either your end or our end. If the connection with a video visit is poor, the visit may have to be switched to a telephone visit. With either a video or telephone visit, we are not always able to ensure that we have a secure connection.  By engaging in this virtual visit, you consent to the provision of healthcare and authorize for your insurance to be billed (if applicable) for the services provided during this visit. Depending on your insurance coverage, you may receive a charge related to this service.  I need to obtain your verbal consent now. Are you willing to proceed with your visit today? Paul Dodson has provided verbal consent on 12/29/2022 for a virtual visit (video or telephone). Paul Dodson, [place]  Date: 12/29/2022 6:44 PM  Virtual Visit via Video Note   I, Paul Dodson, connected with  Paul Dodson  ([id number], [birth year]) on 12/29/22 at  6:45 PM EDT by a video-enabled telemedicine application and verified that I am speaking with the correct person using two identifiers.  Location: Patient: Virtual Visit Location  Patient: Home Provider: Virtual Visit Location Provider: Home Office   I discussed the limitations of evaluation and management by telemedicine and the availability of in person appointments. The patient expressed understanding and agreed to proceed.    History of Present Illness: Paul Dodson is a 67 y.o. who identifies as a male who was assigned male at birth, and is being seen today for some residual cough and congestion for the past 4 weeks. Was initially evaluated in August for viral bronchitis and given short course of steroid with substantial improvement. Notes symptoms did not fully resolve and have been steadily building back up since then. Notes persistent cough that is sometimes dry but other times productive of clear/white phlegm. Denies fever, chills. Denies sinus symptoms. Notes that other than the cough he feels fine.  HPI: HPI  Problems:  Patient Active Problem List   Diagnosis Date Noted   Class 1 obesity due to excess calories without serious comorbidity with body mass index (BMI) of 31.0 to 31.9 in adult 08/24/2021   Elevated blood pressure reading in office without diagnosis of hypertension 08/24/2021   Preventative health care 08/24/2021    Allergies: No Known Allergies Medications:  Current Outpatient Medications:    albuterol (VENTOLIN HFA) 108 (90 Base) MCG/ACT inhaler, Inhale 2 puffs into the lungs every 6 (six) hours as needed for wheezing or shortness of breath., Disp: 8 g, Rfl: 0   benzonatate (TESSALON) 100 MG capsule, Take 1 capsule (100 mg total) by mouth 3 (three) times daily as needed  for cough., Disp: 30 capsule, Rfl: 0   predniSONE (DELTASONE) 20 MG tablet, Take 2 tablets (40 mg total) by mouth daily with breakfast., Disp: 10 tablet, Rfl: 0   gabapentin (NEURONTIN) 300 MG capsule, Take 1 capsule (300 mg total) by mouth at bedtime., Disp: 90 capsule, Rfl: 1  Observations/Objective: Patient is well-developed, well-nourished in no acute distress.  Resting  comfortably at home.  Head is normocephalic, atraumatic.  No labored breathing. Speech is clear and coherent with logical content.  Patient is alert and oriented at baseline.   Assessment and Plan: 1. Bronchitis - predniSONE (DELTASONE) 20 MG tablet; Take 2 tablets (40 mg total) by mouth daily with breakfast.  Dispense: 10 tablet; Refill: 0 - benzonatate (TESSALON) 100 MG capsule; Take 1 capsule (100 mg total) by mouth 3 (three) times daily as needed for cough.  Dispense: 30 capsule; Refill: 0 - albuterol (VENTOLIN HFA) 108 (90 Base) MCG/ACT inhaler; Inhale 2 puffs into the lungs every 6 (six) hours as needed for wheezing or shortness of breath.  Dispense: 8 g; Refill: 0  Viral bronchitis with lingering inflammation. No signs/symptoms raising concern for festering bacterial infection at present. No known history of chronic lung disease. Add on a 5-day burst of prednisone 40 mg daily. Albuterol and Tessalon per orders. Supportive measure and OTC medications reviewed. He is to let us know ASAP if any change in sputum noted or any new or worsening symptoms so we can reassess need for ABX versus in-office evaluation.  Follow Up Instructions: I discussed the assessment and treatment plan with the patient. The patient was provided an opportunity to ask questions and all were answered. The patient agreed with the plan and demonstrated an understanding of the instructions.  A copy of instructions were sent to the patient via MyChart unless otherwise noted below.   The patient was advised to call back or seek an in-person evaluation if the symptoms worsen or if the condition fails to improve as anticipated.    Paul Climes, PA-C

## 2022-12-29 NOTE — Patient Instructions (Signed)
Lorelee New, thank you for joining Piedad Climes, PA-C for today's virtual visit.  While this provider is not your primary care provider (PCP), if your PCP is located in our provider database this encounter information will be shared with them immediately following your visit.   A [facility] MyChart account gives you access to today's visit and all your visits, tests, and labs performed at [facility] " click here if you don't have a [facility] MyChart account or go to [email or web address]  Consent: (Patient) Tremayne Sheldon provided verbal consent for this virtual visit at the beginning of the encounter.  Current Medications:  Current Outpatient Medications:    gabapentin (NEURONTIN) 300 MG capsule, Take 1 capsule (300 mg total) by mouth at bedtime., Disp: 90 capsule, Rfl: 1   Medications ordered in this encounter:  No orders of the defined types were placed in this encounter.    *If you need refills on other medications prior to your next appointment, please contact your pharmacy*  Follow-Up: Call back or seek an in-person evaluation if the symptoms worsen or if the condition fails to improve as anticipated.  [facility] [phone]  Other Instructions Increase fluids.  Get plenty of rest. Use Mucinex for congestion. Take the Prednisone and Tessalon as directed. Use the albuterol as directed, when needed. Take a daily probiotic (I recommend Align or Culturelle, but even Activia Yogurt may be beneficial).  A humidifier placed in the bedroom may offer some relief for a dry, scratchy throat of nasal irritation.  Read information below on acute bronchitis.  If you note any new or worsening symptoms please let me know ASAP.   Acute Bronchitis Bronchitis is when the airways that extend from the windpipe into the lungs get red, puffy, and painful (inflamed). Bronchitis often causes thick spit (mucus) to develop. This leads to a cough. A cough is the  most common symptom of bronchitis. In acute bronchitis, the condition usually begins suddenly and goes away over time (usually in 2 weeks). Smoking, allergies, and asthma can make bronchitis worse. Repeated episodes of bronchitis may cause more lung problems.  HOME CARE Rest. Drink enough fluids to keep your pee (urine) clear or pale yellow (unless you need to limit fluids as told by your doctor). Only take over-the-counter or prescription medicines as told by your doctor. Avoid smoking and secondhand smoke. These can make bronchitis worse. If you are a smoker, think about using nicotine gum or skin patches. Quitting smoking will help your lungs heal faster. Reduce the chance of getting bronchitis again by: Washing your hands often. Avoiding people with cold symptoms. Trying not to touch your hands to your mouth, nose, or eyes. Follow up with your doctor as told.  GET HELP IF: Your symptoms do not improve after 1 week of treatment. Symptoms include: Cough. Fever. Coughing up thick spit. Body aches. Chest congestion. Chills. Shortness of breath. Sore throat.  GET HELP RIGHT AWAY IF:  You have an increased fever. You have chills. You have severe shortness of breath. You have bloody thick spit (sputum). You throw up (vomit) often. You lose too much body fluid (dehydration). You have a severe headache. You faint.  MAKE SURE YOU:  Understand these instructions. Will watch your condition. Will get help right away if you are not doing well or get worse. Document Released: 08/25/2007 Document Revised: 11/08/2012 Document Reviewed: 08/29/2012 [facility] Patient Information 2015 [place], [place]. This information is not intended to replace advice given to you  by your health care provider. Make sure you discuss any questions you have with your health care provider.    If you have been instructed to have an in-person evaluation today at a local Urgent Care facility, please use the link  below. It will take you to a list of all of our available [facility], including address, phone number and hours of operation. Please do not delay care.  [facility]  If you or a family member do not have a primary care provider, use the link below to schedule a visit and establish care. When you choose a [facility] primary care physician or advanced practice provider, you gain a long-term partner in health. Find a Primary Care Provider  Learn more about [facility] in-office and virtual care options:  - Get Care Now

## 2022-12-29 NOTE — Progress Notes (Signed)
Follow-up Visit   Date: 12/29/2022    Paul Dodson MRN: [id number] DOB: 1955/08/23    Paul Dodson is a 67 y.o. right-handed Caucasian male with left CTS release returning to the clinic for follow-up of right CTS and left ulnar neuropathy.  The patient was accompanied to the clinic by wife who also provides collateral information.    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  Patient admits to drinking a pot of coffee daily.  I urged him to cut back on coffee consumption and continue to monitor BP.  If it remains elevated, follow-up with PCP.   For hand paresthesias, continue gabapentin 300mg  at bedtime.  Patient may consider stopping to see if cough improves.  Cough is not a side effect that I have seen with gabapentin, but the only way to know is drug holiday.   Return to clinic in 6 months  --------------------------------------------- History of present illness: He has EMG in 2021 and was told he has severe CTS for which he underwent release by Dr. Danielle Dess.  He did not appreciate any benefit.   He continues to have numbness from the wrist into the hands.  It is worse with activity.  He does not have any weakness in the hands.  He then saw Dr. Wynetta Emery who ordered MRI cervical spine which did not show compressive pathology in the cervical spine.  He also ordered EMG which showed right CTS (moderate) and left ulnar neuropathy at the elbow (moderate).  There was no evidence of neuropathy.   UPDATE 12/29/2022:  Since starting gabapentin 300mg  at bedtime, but he reports having a dry cough since starting this.  He continues to have numbness in the hands, but tingling is not as intense.  He is not dropping things.  He is not dropping things and denies weakness.   Medications:  Current Outpatient Medications on File Prior to Visit  Medication Sig Dispense Refill   gabapentin (NEURONTIN) 300  MG capsule Take 1 capsule (300 mg total) by mouth at bedtime. 30 capsule 3   No current facility-administered medications on file prior to visit.    Allergies: No Known Allergies  Vital Signs:  BP (!) 178/102   Pulse 81   Ht 6\' 3"  (1.905 m)   Wt 229 lb (103.9 kg)   SpO2 94%   BMI 28.62 kg/m    Neurological Exam: MENTAL STATUS including orientation to time, place, person, recent and remote memory, attention span and concentration, language, and fund of knowledge is normal.  Speech is not dysarthric.  CRANIAL NERVES:   Pupils equal round and reactive to light.  Normal conjugate, extra-ocular eye movements in all directions of gaze.  No ptosis.  Face is symmetric.  MOTOR:  Motor strength is 5/5 in all extremities.  No atrophy, fasciculations or abnormal movements.  No pronator drift.  Tone is normal.    MSRs:  Reflexes are 2+/4 throughout.  SENSORY:  Intact to vibration throughout.  COORDINATION/GAIT:    Gait narrow based and stable.   Data: NCS/EMG of the arms 03/02/2022: Right median neuropathy at or distal to the wrist, consistent with a clinical diagnosis of carpal tunnel syndrome.  Overall, these findings are moderate in degree electrically. Left ulnar neuropathy with slowing across the elbow, with demyelinating and axonal features, moderate. There is no evidence of a large fiber sensorimotor polyneuropathy or cervical radiculopathy affecting the upper extremities.    Total time spent reviewing  records, interview, history/exam, documentation, and coordination of care on day of encounter:  30 min   Thank you for allowing me to participate in patient's care.  If I can answer any additional questions, I would be pleased to do so.    Sincerely,    Kelissa Merlin K. Allena Katz, DO

## 2023-01-05 ENCOUNTER — Telehealth: Payer: PPO

## 2023-01-05 ENCOUNTER — Telehealth: Payer: PPO | Admitting: Nurse Practitioner

## 2023-01-05 DIAGNOSIS — G5600 Carpal tunnel syndrome, unspecified upper limb: Secondary | ICD-10-CM | POA: Insufficient documentation

## 2023-01-05 DIAGNOSIS — G959 Disease of spinal cord, unspecified: Secondary | ICD-10-CM | POA: Insufficient documentation

## 2023-01-05 DIAGNOSIS — R200 Anesthesia of skin: Secondary | ICD-10-CM | POA: Insufficient documentation

## 2023-01-05 DIAGNOSIS — J40 Bronchitis, not specified as acute or chronic: Secondary | ICD-10-CM

## 2023-01-05 MED ORDER — AZITHROMYCIN 250 MG PO TABS: ORAL_TABLET | ORAL | 0 refills | Status: AC

## 2023-01-05 MED ORDER — PREDNISONE 20 MG PO TABS: 20.0000 mg | ORAL_TABLET | Freq: Two times a day (BID) | ORAL | 0 refills | Status: AC

## 2023-01-05 NOTE — Progress Notes (Signed)
Virtual Visit Consent   Paul Dodson, you are scheduled for a virtual visit with a [facility] provider today. Just as with appointments in the office, your consent must be obtained to participate. Your consent will be active for this visit and any virtual visit you may have with one of our providers in the next 365 days. If you have a MyChart account, a copy of this consent can be sent to you electronically.  As this is a virtual visit, video technology does not allow for your provider to perform a traditional examination. This may limit your provider's ability to fully assess your condition. If your provider identifies any concerns that need to be evaluated in person or the need to arrange testing (such as labs, EKG, etc.), we will make arrangements to do so. Although advances in technology are sophisticated, we cannot ensure that it will always work on either your end or our end. If the connection with a video visit is poor, the visit may have to be switched to a telephone visit. With either a video or telephone visit, we are not always able to ensure that we have a secure connection.  By engaging in this virtual visit, you consent to the provision of healthcare and authorize for your insurance to be billed (if applicable) for the services provided during this visit. Depending on your insurance coverage, you may receive a charge related to this service.  I need to obtain your verbal consent now. Are you willing to proceed with your visit today? Paul Dodson has provided verbal consent on 01/05/2023 for a virtual visit (video or telephone). Viviano Simas, FNP  Date: 01/05/2023 7:15 PM  Virtual Visit via Video Note   I, Viviano Simas, connected with  Paul Dodson  ([id number], 1955/04/04) on 01/05/23 at  7:30 PM EDT by a video-enabled telemedicine application and verified that I am speaking with the correct person using two identifiers.  Location: Patient: Virtual Visit Location Patient:  Home Provider: Virtual Visit Location Provider: Home Office   I discussed the limitations of evaluation and management by telemedicine and the availability of in person appointments. The patient expressed understanding and agreed to proceed.    History of Present Illness: Paul Dodson is a 67 y.o. who identifies as a male who was assigned male at birth, and is being seen today for an ongoing dry cough- at times produces white "phlem"  Denies wheezing or shortness of breath  Was able to till the garden for three hours today   He was treated on 12/29/22 for viral bronchitis with prednisone, benzonatate and Albuterol    Of note he was also seen in August with similar symptoms and given a prednisone taper at that time  Started after a visit to the beach - he did have a fever and productive cough at that time  He had a telehealth visit at that time  The cough did resolve and then returned in early October   Has not seen his PCP since June 2023   He has not had COVID nor has he been vaccinated   Denies tobacco   Problems:  Patient Active Problem List   Diagnosis Date Noted   Class 1 obesity due to excess calories without serious comorbidity with body mass index (BMI) of 31.0 to 31.9 in adult 08/24/2021   Elevated blood pressure reading in office without diagnosis of hypertension 08/24/2021   Preventative health care 08/24/2021    Allergies: No Known Allergies  Medications:  Current Outpatient  Medications:    albuterol (VENTOLIN HFA) 108 (90 Base) MCG/ACT inhaler, Inhale 2 puffs into the lungs every 6 (six) hours as needed for wheezing or shortness of breath., Disp: 8 g, Rfl: 0   benzonatate (TESSALON) 100 MG capsule, Take 1 capsule (100 mg total) by mouth 3 (three) times daily as needed for cough., Disp: 30 capsule, Rfl: 0   gabapentin (NEURONTIN) 300 MG capsule, Take 1 capsule (300 mg total) by mouth at bedtime., Disp: 90 capsule, Rfl: 1   predniSONE (DELTASONE) 20 MG tablet, Take 2  tablets (40 mg total) by mouth daily with breakfast., Disp: 10 tablet, Rfl: 0  Observations/Objective: Patient is well-developed, well-nourished in no acute distress.  Resting comfortably  at home.  Head is normocephalic, atraumatic.  No labored breathing.  Speech is clear and coherent with logical content.  Patient is alert and oriented at baseline.    Assessment and Plan:  1. Bronchitis  Take both medications with food  Strict follow up with PCP in the next 1-2 weeks   - azithromycin (ZITHROMAX) 250 MG tablet; Take 2 tablets on day 1, then 1 tablet daily on days 2 through 5  Dispense: 6 tablet; Refill: 0 - predniSONE (DELTASONE) 20 MG tablet; Take 1 tablet (20 mg total) by mouth 2 (two) times daily with a meal for 5 days.  Dispense: 10 tablet; Refill: 0     Follow Up Instructions: I discussed the assessment and treatment plan with the patient. The patient was provided an opportunity to ask questions and all were answered. The patient agreed with the plan and demonstrated an understanding of the instructions.  A copy of instructions were sent to the patient via MyChart unless otherwise noted below.    The patient was advised to call back or seek an in-person evaluation if the symptoms worsen or if the condition fails to improve as anticipated.    Viviano Simas, FNP

## 2023-01-12 ENCOUNTER — Encounter: Payer: Self-pay | Admitting: Nurse Practitioner

## 2023-01-12 ENCOUNTER — Ambulatory Visit: Payer: PPO | Admitting: Nurse Practitioner

## 2023-01-12 VITALS — BP 160/94 | HR 74 | Temp 98.0°F | Ht 75.0 in | Wt 222.4 lb

## 2023-01-12 DIAGNOSIS — I10 Essential (primary) hypertension: Secondary | ICD-10-CM | POA: Diagnosis not present

## 2023-01-12 DIAGNOSIS — Z125 Encounter for screening for malignant neoplasm of prostate: Secondary | ICD-10-CM

## 2023-01-12 DIAGNOSIS — Z0000 Encounter for general adult medical examination without abnormal findings: Secondary | ICD-10-CM | POA: Diagnosis not present

## 2023-01-12 DIAGNOSIS — R7303 Prediabetes: Secondary | ICD-10-CM | POA: Insufficient documentation

## 2023-01-12 DIAGNOSIS — G5601 Carpal tunnel syndrome, right upper limb: Secondary | ICD-10-CM | POA: Diagnosis not present

## 2023-01-12 LAB — COMPREHENSIVE METABOLIC PANEL
ALT: 13 U/L (ref 0–53)
AST: 11 U/L (ref 0–37)
Albumin: 4.2 g/dL (ref 3.5–5.2)
Alkaline Phosphatase: 64 U/L (ref 39–117)
BUN: 16 mg/dL (ref 6–23)
CO2: 30 meq/L (ref 19–32)
Calcium: 9.3 mg/dL (ref 8.4–10.5)
Chloride: 101 meq/L (ref 96–112)
Creatinine, Ser: 0.8 mg/dL (ref 0.40–1.50)
GFR: 92 mL/min (ref >60.00)
Glucose, Bld: 106 mg/dL — ABNORMAL HIGH (ref 70–99)
Potassium: 4.2 meq/L (ref 3.5–5.1)
Sodium: 137 meq/L (ref 135–145)
Total Bilirubin: 0.6 mg/dL (ref 0.2–1.2)
Total Protein: 7 g/dL (ref 6.0–8.3)

## 2023-01-12 LAB — LIPID PANEL
Cholesterol: 186 mg/dL (ref 0–200)
HDL: 52.1 mg/dL (ref >39.00)
LDL Cholesterol: 123 mg/dL — ABNORMAL HIGH (ref 0–99)
NonHDL: 134.06
Total CHOL/HDL Ratio: 4
Triglycerides: 53 mg/dL (ref 0.0–149.0)
VLDL: 10.6 mg/dL (ref 0.0–40.0)

## 2023-01-12 LAB — CBC
HCT: 46.2 % (ref 39.0–52.0)
Hemoglobin: 15.3 g/dL (ref 13.0–17.0)
MCHC: 33.2 g/dL (ref 30.0–36.0)
MCV: 96.6 fl (ref 78.0–100.0)
Platelets: 248 K/uL (ref 150.0–400.0)
RBC: 4.78 Mil/uL (ref 4.22–5.81)
RDW: 14.7 % (ref 11.5–15.5)
WBC: 7.1 K/uL (ref 4.0–10.5)

## 2023-01-12 LAB — TSH: TSH: 0.39 u[IU]/mL (ref 0.35–5.50)

## 2023-01-12 LAB — PSA: PSA: 0.8 ng/mL (ref 0.10–4.00)

## 2023-01-12 LAB — HEMOGLOBIN A1C: Hgb A1c MFr Bld: 6.1 % (ref 4.6–6.5)

## 2023-01-12 MED ORDER — AMLODIPINE BESYLATE 2.5 MG PO TABS: 2.5000 mg | ORAL_TABLET | Freq: Every day | ORAL | 0 refills | Status: DC

## 2023-01-12 NOTE — Assessment & Plan Note (Signed)
Discussed age-appropriate immunizations and screening exams.  Did review patient's personal, surgical, social, family histories.  Patient is up-to-date on all age-appropriate vaccinations he would like.  He declines flu, Shingrix, pneumonia vaccine today.  Patient is due for CRC screening due to suboptimal prep patient will defer and states he will repeat in 3 years which will be 2026.  PSA done today for prostate cancer screening.  Patient was given information at discharge about preventative healthcare maintenance with anticipatory guidance.

## 2023-01-12 NOTE — Progress Notes (Signed)
Established Patient Office Visit  Subjective   Patient ID: Paul Dodson, male    DOB: [birth year]  Age: 67 y.o. MRN: [id number]  Chief Complaint  Patient presents with   Annual Exam    HPI  for complete physical and follow up of chronic conditions.   Cervical myelopathy/carpal tunnel: is followed by Dr. Allena Katz  and does gabapentin at bedtime.   Immunizations: -Tetanus: Completed in 2016 -Influenza: Refused -Shingles: Refused -Pneumonia: Refused  Diet: Fair diet. 3 meals a day and sometimes a snack. States he will drink coffee and water  Exercise: No regular exercise. works around [employer] exam:  Contacts needs updating  Dental exam: As needed patient has full set of dentures  Colonoscopy: Completed in  09/17/2021, repeat in 1 year.  Patient states that he would not want to repeat any sooner than 3 years.  He did have questions about Cologuard told him that he was not an ideal candidate in regards to Cologuard as he had polyps in the past. Lung Cancer Screening: N/A  PSA: Due       Review of Systems  Constitutional:  Negative for chills and fever.  Respiratory:  Negative for shortness of breath.   Cardiovascular:  Negative for chest pain and leg swelling.  Gastrointestinal:  Negative for abdominal pain, blood in stool, constipation, diarrhea, nausea and vomiting.  Genitourinary:  Negative for dysuria and hematuria.  Neurological:  Negative for tingling and headaches.  Psychiatric/Behavioral:  Negative for hallucinations and suicidal ideas.       Objective:     BP (!) 160/94   Pulse 74   Temp 98 F (36.7 C) (Oral)   Ht 6\' 3"  (1.905 m)   Wt 222 lb 6.4 oz (100.9 kg)   SpO2 95%   BMI 27.80 kg/m  BP Readings from Last 3 Encounters:  01/12/23 (!) 160/94  12/29/22 (!) 152/98  10/04/22 (!) 156/100   Wt Readings from Last 3 Encounters:  01/12/23 222 lb 6.4 oz (100.9 kg)  12/29/22 229 lb (103.9 kg)  10/04/22 233 lb (105.7 kg)   SpO2 Readings from  Last 3 Encounters:  01/12/23 95%  12/29/22 94%  10/04/22 97%      Physical Exam Vitals and nursing note reviewed.  Constitutional:      Appearance: Normal appearance.  HENT:     Right Ear: Tympanic membrane, ear canal and external ear normal.     Left Ear: Tympanic membrane, ear canal and external ear normal.     Mouth/Throat:     Mouth: Mucous membranes are moist.     Pharynx: Oropharynx is clear.  Eyes:     Extraocular Movements: Extraocular movements intact.     Pupils: Pupils are equal, round, and reactive to light.  Cardiovascular:     Rate and Rhythm: Normal rate and regular rhythm.     Pulses: Normal pulses.     Heart sounds: Normal heart sounds.  Pulmonary:     Effort: Pulmonary effort is normal.     Breath sounds: Normal breath sounds.  Abdominal:     General: Bowel sounds are normal. There is no distension.     Palpations: There is no mass.     Tenderness: There is no abdominal tenderness.     Hernia: No hernia is present.  Musculoskeletal:     Right lower leg: No edema.     Left lower leg: No edema.  Lymphadenopathy:     Cervical: No cervical adenopathy.  Skin:  General: Skin is warm.  Neurological:     General: No focal deficit present.     Mental Status: He is alert.     Deep Tendon Reflexes:     Reflex Scores:      Bicep reflexes are 2+ on the right side and 2+ on the left side.      Patellar reflexes are 2+ on the right side and 2+ on the left side.    Comments: Bilateral upper and lower extremity strength 5/5  Psychiatric:        Mood and Affect: Mood normal.        Behavior: Behavior normal.        Thought Content: Thought content normal.        Judgment: Judgment normal.      No results found for any visits on 01/12/23.    The 10-year ASCVD risk score (Arnett DK, et al., 2019) is: 21.1%    Assessment & Plan:   Problem List Items Addressed This Visit       Cardiovascular and Mediastinum   Primary hypertension    Patient has had  several readings of elevated blood pressure.  Will start patient on amlodipine 2.5 mg daily follow-up in 6 weeks for blood pressure recheck      Relevant Medications   amLODipine (NORVASC) 2.5 MG tablet   Other Relevant Orders   Lipid panel     Nervous and Auditory   Carpal tunnel syndrome    History of the same is followed by Dr. Allena Katz neurologist.  Patient on gabapentin continue        Other   Preventative health care - Primary    Discussed age-appropriate immunizations and screening exams.  Did review patient's personal, surgical, social, family histories.  Patient is up-to-date on all age-appropriate vaccinations he would like.  He declines flu, Shingrix, pneumonia vaccine today.  Patient is due for CRC screening due to suboptimal prep patient will defer and states he will repeat in 3 years which will be 2026.  PSA done today for prostate cancer screening.  Patient was given information at discharge about preventative healthcare maintenance with anticipatory guidance.      Relevant Orders   CBC   Comprehensive metabolic panel   TSH   Prediabetes    History of the same.  Pending A1c      Relevant Orders   Hemoglobin A1c   Other Visit Diagnoses     Screening for prostate cancer       Relevant Orders   PSA       Return in about 6 weeks (around 02/23/2023) for BP recheck.    Audria Nine, NP

## 2023-01-12 NOTE — Assessment & Plan Note (Signed)
History of the same is followed by Dr. Allena Katz neurologist.  Patient on gabapentin continue

## 2023-01-12 NOTE — Patient Instructions (Signed)
Nice to see you today I will be in touch with the labs once I have reviewed them Follow up with me in 6 weeks for a blood pressure recheck

## 2023-01-12 NOTE — Assessment & Plan Note (Signed)
Patient has had several readings of elevated blood pressure.  Will start patient on amlodipine 2.5 mg daily follow-up in 6 weeks for blood pressure recheck

## 2023-01-12 NOTE — Assessment & Plan Note (Signed)
History of the same.  Pending A1c

## 2023-02-22 DIAGNOSIS — H18613 Keratoconus, stable, bilateral: Secondary | ICD-10-CM | POA: Diagnosis not present

## 2023-02-28 ENCOUNTER — Ambulatory Visit (INDEPENDENT_AMBULATORY_CARE_PROVIDER_SITE_OTHER): Payer: PPO | Admitting: Nurse Practitioner

## 2023-02-28 ENCOUNTER — Encounter: Payer: Self-pay | Admitting: Nurse Practitioner

## 2023-02-28 VITALS — BP 146/90 | HR 80 | Temp 98.3°F | Ht 75.0 in | Wt 233.4 lb

## 2023-02-28 DIAGNOSIS — I10 Essential (primary) hypertension: Secondary | ICD-10-CM

## 2023-02-28 MED ORDER — AMLODIPINE BESYLATE 5 MG PO TABS: 5.0000 mg | ORAL_TABLET | Freq: Every day | ORAL | 0 refills | Status: DC

## 2023-02-28 NOTE — Patient Instructions (Signed)
Nice to see you today You can take 2 tablets of the 2.5mg  amlodipine until you finish what you have. I have sent in an updated prescription to the pharmacy  Follow up with me in 6 weeks, sooner if you need me

## 2023-02-28 NOTE — Progress Notes (Signed)
   Established Patient Office Visit  Subjective   Patient ID: Paul Dodson, male    DOB: 03-12-1956  Age: 66 y.o. MRN: [id number]  Chief Complaint  Patient presents with   Follow-up    BP Check. 150/92 was the last reading. Checks with wrist and arm.     HPI  Hypertension: patient was seen by me on 01/12/2023 with an elecated blood pressure. He was started on amlodipine 2.5mg  States that he is checking his blood pressure at home daily. States that highest was 176 on the top. He had been doing a lot previous. States that it has been 150s consistently.  Patient has tolerated the medication well.  States the first couple days he had some constipation but has since resolved.  He also notes that when he was standing at the kitchen counter he felt a little off balance a couple of times in the getting but that has also resolved.  He denies any lightheadedness or dizziness    Review of Systems  Constitutional:  Negative for chills and fever.  Respiratory:  Negative for shortness of breath.   Cardiovascular:  Negative for chest pain and leg swelling.  Gastrointestinal:  Negative for constipation.  Neurological:  Negative for dizziness and headaches.      Objective:     BP (!) 146/90   Pulse 80   Temp 98.3 F (36.8 C) (Oral)   Ht 6\' 3"  (1.905 m)   Wt 233 lb 6.4 oz (105.9 kg)   SpO2 97%   BMI 29.17 kg/m    Physical Exam Vitals and nursing note reviewed.  Constitutional:      Appearance: Normal appearance.  Cardiovascular:     Rate and Rhythm: Normal rate and regular rhythm.     Heart sounds: Normal heart sounds.  Pulmonary:     Effort: Pulmonary effort is normal.     Breath sounds: Normal breath sounds.  Abdominal:     General: Bowel sounds are normal.  Neurological:     Mental Status: He is alert.      No results found for any visits on 02/28/23.    The 10-year ASCVD risk score (Arnett DK, et al., 2019) is: 18.4%    Assessment & Plan:   Problem List Items  Addressed This Visit       Cardiovascular and Mediastinum   Primary hypertension - Primary    Patient was maintained on amlodipine 2.5 mg.  Patient's blood pressure not within limits.  Will increase amlodipine to 5 mg daily.  Can check blood pressure 3 times a week at home follow-up with me in 6 weeks sooner if needed      Relevant Medications   amLODipine (NORVASC) 5 MG tablet    Return in about 6 weeks (around 04/11/2023) for BP recheck.    Audria Nine, NP

## 2023-02-28 NOTE — Assessment & Plan Note (Signed)
Patient was maintained on amlodipine 2.5 mg.  Patient's blood pressure not within limits.  Will increase amlodipine to 5 mg daily.  Can check blood pressure 3 times a week at home follow-up with me in 6 weeks sooner if needed

## 2023-04-11 ENCOUNTER — Ambulatory Visit (INDEPENDENT_AMBULATORY_CARE_PROVIDER_SITE_OTHER): Payer: PPO | Admitting: Nurse Practitioner

## 2023-04-11 VITALS — BP 148/90 | HR 85 | Temp 98.6°F | Ht 75.0 in | Wt 230.0 lb

## 2023-04-11 DIAGNOSIS — I10 Essential (primary) hypertension: Secondary | ICD-10-CM

## 2023-04-11 MED ORDER — AMLODIPINE BESYLATE 10 MG PO TABS: 10.0000 mg | ORAL_TABLET | Freq: Every day | ORAL | 1 refills | Status: DC

## 2023-04-11 NOTE — Patient Instructions (Signed)
Nice to see you today I am going to increase the amlodipine to 10mg  a day You can take 2 of the 5mg  tablets until you finish what you have Continue to check blood pressure at home Follow up with me in 3 months, sooner if you need me

## 2023-04-11 NOTE — Assessment & Plan Note (Signed)
Patient currently maintained on amlodipine 5 mg daily.  We will titrate amlodipine from 5 mg to 10 mg daily.  Patient can take to the amlodipine 5 mg tablets daily until supply is finished.  New prescription for amlodipine 10 mg in the pharmacy.  Continue spot checking blood pressure at home

## 2023-04-11 NOTE — Progress Notes (Signed)
   Established Patient Office Visit  Subjective   Patient ID: Paul Dodson, male    DOB: Nov 23, 1955  Age: 68 y.o. MRN: [id number]  Chief Complaint  Patient presents with   Follow-up    BP Recheck. Most recent BP reading. 147/90. Pt states he has had 3 BP readings below 130.     HPI  HTN: patient was seen by me on 02/28/2023 and was titrated up to amlodipine 5mg  from amlodipine 2.5mg . He has been checking his blood pressure at home and getting readings of 140/90s. States that the has had 3 occasion 130s/85 States that he is tolerating the blood pressure medications well    Review of Systems  Constitutional:  Negative for chills and fever.  Respiratory:  Negative for shortness of breath.   Cardiovascular:  Negative for chest pain.  Neurological:  Negative for dizziness and headaches.      Objective:     BP (!) 148/90   Pulse 85   Temp 98.6 F (37 C) (Oral)   Ht 6\' 3"  (1.905 m)   Wt 230 lb (104.3 kg)   SpO2 97%   BMI 28.75 kg/m  BP Readings from Last 3 Encounters:  04/11/23 (!) 148/90  02/28/23 (!) 146/90  01/12/23 (!) 160/94   Wt Readings from Last 3 Encounters:  04/11/23 230 lb (104.3 kg)  02/28/23 233 lb 6.4 oz (105.9 kg)  01/12/23 222 lb 6.4 oz (100.9 kg)   SpO2 Readings from Last 3 Encounters:  04/11/23 97%  02/28/23 97%  01/12/23 95%      Physical Exam Vitals and nursing note reviewed.  Constitutional:      Appearance: Normal appearance.  Cardiovascular:     Rate and Rhythm: Normal rate and regular rhythm.     Heart sounds: Normal heart sounds.  Pulmonary:     Effort: Pulmonary effort is normal.     Breath sounds: Normal breath sounds.  Abdominal:     General: Bowel sounds are normal.  Neurological:     Mental Status: He is alert.      No results found for any visits on 04/11/23.    The 10-year ASCVD risk score (Arnett DK, et al., 2019) is: 20.2%    Assessment & Plan:   Problem List Items Addressed This Visit        Cardiovascular and Mediastinum   Primary hypertension - Primary   Patient currently maintained on amlodipine 5 mg daily.  We will titrate amlodipine from 5 mg to 10 mg daily.  Patient can take to the amlodipine 5 mg tablets daily until supply is finished.  New prescription for amlodipine 10 mg in the pharmacy.  Continue spot checking blood pressure at home      Relevant Medications   amLODipine (NORVASC) 10 MG tablet    Return in about 3 months (around 07/10/2023) for BP recheck.    Audria Nine, NP

## 2023-07-04 ENCOUNTER — Encounter: Payer: Self-pay | Admitting: Neurology

## 2023-07-04 ENCOUNTER — Ambulatory Visit (INDEPENDENT_AMBULATORY_CARE_PROVIDER_SITE_OTHER): Payer: PPO | Admitting: Neurology

## 2023-07-04 VITALS — BP 172/86 | HR 98 | Ht 75.0 in | Wt 230.0 lb

## 2023-07-04 DIAGNOSIS — G5622 Lesion of ulnar nerve, left upper limb: Secondary | ICD-10-CM | POA: Diagnosis not present

## 2023-07-04 DIAGNOSIS — G5601 Carpal tunnel syndrome, right upper limb: Secondary | ICD-10-CM | POA: Diagnosis not present

## 2023-07-04 MED ORDER — GABAPENTIN 300 MG PO CAPS
300.0000 mg | ORAL_CAPSULE | Freq: Every day | ORAL | 1 refills | Status: DC
Start: 2023-07-04 — End: 2024-01-31

## 2023-07-04 NOTE — Progress Notes (Signed)
 Follow-up Visit   Date: 07/04/2023    Paul Dodson MRN: [id number] DOB: [birth year]    Paul Dodson is a 68 y.o. right-handed Caucasian male with left CTS release returning to the clinic for follow-up of right CTS and left ulnar neuropathy.  The patient was accompanied to the clinic by self.  IMPRESSION/PLAN: Right carpal tunnel syndrome, moderate, stable. Left ulnar neuropathy at the elbow, moderate, stable.  Given no significant benefit with gabapentin, he can try to stop it.  If pain returns, resume gabapentin 300mg  at bedtime.    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 Danielle Dess.  He did not appreciate any benefit.   He continues to have numbness from the wrist into the hands.  It is worse with activity.  He does not have any weakness in the hands.  He then saw Dr. Wynetta Emery who ordered MRI cervical spine which did not show compressive pathology in the cervical spine.  He also ordered EMG which showed right CTS (moderate) and left ulnar neuropathy at the elbow (moderate).  There was no evidence of neuropathy.   UPDATE 12/29/2022:  Since starting gabapentin 300mg  at bedtime, but he reports having a dry cough since starting this.  He continues to have numbness in the hands, but tingling is not as intense.  He is not dropping things.  He is not dropping things and denies weakness.   UPDATE 07/04/2023:  He is here for follow-up. He continues to have numbness of the hands.  He does not have pain.  He rarely drops items.  He is still able to carry out his usual tasks. He takes gabapentin 300mg  at bedtime would like to try stopping it because he does not have any pain. 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  Medications:  Current Outpatient Medications on File Prior to Visit  Medication Sig Dispense  Refill   amLODipine (NORVASC) 10 MG tablet Take 1 tablet (10 mg total) by mouth daily. 90 tablet 1   gabapentin (NEURONTIN) 300 MG capsule Take 1 capsule (300 mg total) by mouth at bedtime. 90 capsule 1   No current facility-administered medications on file prior to visit.    Allergies: No Known Allergies  Vital Signs:  BP (!) 172/86 Comment: Running up stairs no time to sit and calm down.  Pulse 98   Ht 6\' 3"  (1.905 m)   Wt 230 lb (104.3 kg)   SpO2 99%   BMI 28.75 kg/m    Neurological Exam: MENTAL STATUS including orientation to time, place, person, recent and remote memory, attention span and concentration, language, and fund of knowledge is normal.  Speech is not dysarthric.  CRANIAL NERVES:   Pupils equal round and reactive to light.  Normal conjugate, extra-ocular eye movements in all directions of gaze.  No ptosis.  Face is symmetric.  MOTOR:  Motor strength is 5/5 in all extremities.  No atrophy, fasciculations or abnormal movements.  No pronator drift.  Tone is normal.    MSRs:  Reflexes are 2+/4 throughout.  SENSORY:  Intact to vibration throughout.  COORDINATION/GAIT:    Gait narrow based and stable.   Data: NCS/EMG of the arms 03/02/2022: Right median neuropathy at or distal to the wrist, consistent with a clinical diagnosis of carpal tunnel syndrome.  Overall, these findings are moderate in degree  electrically. Left ulnar neuropathy with slowing across the elbow, with demyelinating and axonal features, moderate. There is no evidence of a large fiber sensorimotor polyneuropathy or cervical radiculopathy affecting the upper extremities.    Thank you for allowing me to participate in patient's care.  If I can answer any additional questions, I would be pleased to do so.    Sincerely,    Harpreet Signore K. Lydia Sams, DO

## 2023-07-11 ENCOUNTER — Telehealth: Admitting: Emergency Medicine

## 2023-07-11 ENCOUNTER — Ambulatory Visit: Payer: PPO | Admitting: Nurse Practitioner

## 2023-07-11 DIAGNOSIS — J029 Acute pharyngitis, unspecified: Secondary | ICD-10-CM

## 2023-07-11 DIAGNOSIS — R051 Acute cough: Secondary | ICD-10-CM

## 2023-07-11 MED ORDER — PROMETHAZINE-DM 6.25-15 MG/5ML PO SYRP: 5.0000 mL | ORAL_SOLUTION | Freq: Four times a day (QID) | ORAL | 0 refills | Status: DC | PRN

## 2023-07-11 NOTE — Progress Notes (Signed)
 E-Visit for Sore Throat  We are sorry that you are not feeling well.  Here is how we plan to help!  Your symptoms indicate a likely viral infection (Pharyngitis).   Pharyngitis is inflammation in the back of the throat which can cause a sore throat, scratchiness and sometimes difficulty swallowing.   Pharyngitis is typically caused by a respiratory virus and will just run its course.  Please keep in mind that your symptoms could last up to 10 days.  For throat pain, we recommend over the counter oral pain relief medications such as acetaminophen  or aspirin, or anti-inflammatory medications such as ibuprofen or naproxen sodium.  Topical treatments such as oral throat lozenges or sprays may be used as needed.  Avoid close contact with loved ones, especially the very young and elderly.  Remember to wash your hands thoroughly throughout the day as this is the number one way to prevent the spread of infection and wipe down door knobs and counters with disinfectant.  I would recommend taking Zyrtec or Claritin due to the amount of pollen in the air right now.  This can help to dry up post nasal secretions that could be causing your symptoms.  Zyrtec and Claritin are both available over the counter.  After careful review of your answers, I would not recommend an antibiotic for your condition.  Antibiotics should not be used to treat conditions that we suspect are caused by viruses like the virus that causes the common cold or flu. However, some people can have Strep with atypical symptoms. You may need formal testing in clinic or office to confirm if your symptoms continue or worsen.  Providers prescribe antibiotics to treat infections caused by bacteria. Antibiotics are very powerful in treating bacterial infections when they are used properly.  To maintain their effectiveness, they should be used only when necessary.  Overuse of antibiotics has resulted in the development of super bugs that are resistant to  treatment!    Home Care: Only take medications as instructed by your medical team. Do not drink alcohol while taking these medications. A steam or ultrasonic humidifier can help congestion.  You can place a towel over your head and breathe in the steam from hot water coming from a faucet. Avoid close contacts especially the very young and the elderly. Cover your mouth when you cough or sneeze. Always remember to wash your hands.  Get Help Right Away If: You develop worsening fever or throat pain. You develop a severe head ache or visual changes. Your symptoms persist after you have completed your treatment plan.  Make sure you Understand these instructions. Will watch your condition. Will get help right away if you are not doing well or get worse.   Thank you for choosing an e-visit.  Your e-visit answers were reviewed by a board certified advanced clinical practitioner to complete your personal care plan. Depending upon the condition, your plan could have included both over the counter or prescription medications.  Please review your pharmacy choice. Make sure the pharmacy is open so you can pick up prescription now. If there is a problem, you may contact your provider through [employer] and have the prescription routed to another pharmacy.  Your safety is important to us . If you have drug allergies check your prescription carefully.   For the next 24 hours you can use MyChart to ask questions about today's visit, request a non-urgent call back, or ask for a work or school excuse. You will get  an email in the next two days asking about your experience. I hope that your e-visit has been valuable and will speed your recovery.  Approximately 5 minutes was used in reviewing the patient's chart, questionnaire, prescribing medications, and documentation.

## 2023-07-11 NOTE — Addendum Note (Signed)
 Addended by: Angelia Kelp on: 07/11/2023 11:24 AM   Modules accepted: Orders

## 2023-09-05 ENCOUNTER — Encounter

## 2023-10-01 ENCOUNTER — Other Ambulatory Visit: Payer: Self-pay | Admitting: Nurse Practitioner

## 2023-10-01 DIAGNOSIS — I10 Essential (primary) hypertension: Secondary | ICD-10-CM

## 2023-10-03 NOTE — Telephone Encounter (Signed)
 Patient is overdue for a BP recheck office visit. Please schedule in the next 30 days to continue getting refills

## 2023-10-03 NOTE — Telephone Encounter (Signed)
 Spoke to pt, sch f/u for 10/21/23

## 2023-10-14 NOTE — Telephone Encounter (Unsigned)
 Copied from CRM [id number]. Topic: Appointments - Scheduling Inquiry for Clinic >> Oct 14, 2023  8:17 AM Laymon HERO wrote: Reason for CRM: Patient wanting to know if he needs to still keep his BP follow up visit on 8/1, please reach out to patient

## 2023-10-21 ENCOUNTER — Ambulatory Visit: Admitting: Nurse Practitioner

## 2023-10-21 ENCOUNTER — Encounter: Payer: Self-pay | Admitting: Nurse Practitioner

## 2023-10-21 DIAGNOSIS — I10 Essential (primary) hypertension: Secondary | ICD-10-CM | POA: Diagnosis not present

## 2023-10-21 MED ORDER — AMLODIPINE BESYLATE 10 MG PO TABS: 10.0000 mg | ORAL_TABLET | Freq: Every day | ORAL | 3 refills | Status: AC

## 2023-10-21 NOTE — Assessment & Plan Note (Signed)
 Maintained on amlodipine  10 mg daily.  Tolerating medication well.  Blood pressure controlled continue medication as prescribed

## 2023-10-21 NOTE — Patient Instructions (Signed)
 Nice to see you today  I have refilled the blood pressure medication Keep your appointment with me for your physical and labs. Follow up with me sooner if you need me

## 2023-10-21 NOTE — Progress Notes (Signed)
   Established Patient Office Visit  Subjective   Patient ID: Paul Dodson, male    DOB: 03/11/1956  Age: 68 y.o. MRN: [id number]  Chief Complaint  Patient presents with   Follow-up    Blood Pressure check. Pt states last reading was 135/85.     HPI   HTN: Patient last seen by me on 04/11/2023 for hypertension.  At that juncture he was maintained on amlodipine  5 mg and we increased it to 10 mg at that point.  Encourage patient to check blood pressure at home requested a 62-month follow-up which did not come to fruition.  Patient is here for follow-up. He has been checking his blood pressure at home a couple times a week and has been getting readings of a highest of 170/85. He is getting reading of the higher 130-80s.    Review of Systems  Constitutional:  Negative for chills and fever.  Respiratory:  Negative for shortness of breath.   Cardiovascular:  Negative for chest pain.  Neurological:  Negative for dizziness and headaches.      Objective:     BP 134/80   Pulse 67   Temp 98 F (36.7 C) (Oral)   Ht 6' 3 (1.905 m)   Wt 230 lb (104.3 kg) Comment: 220 at home  SpO2 95%   BMI 28.75 kg/m    Physical Exam Vitals and nursing note reviewed.  Constitutional:      Appearance: Normal appearance.  Cardiovascular:     Rate and Rhythm: Normal rate and regular rhythm.     Heart sounds: Normal heart sounds.  Pulmonary:     Effort: Pulmonary effort is normal.     Breath sounds: Normal breath sounds.  Musculoskeletal:     Right lower leg: No edema.     Left lower leg: No edema.  Neurological:     Mental Status: He is alert.      No results found for any visits on 10/21/23.    The 10-year ASCVD risk score (Arnett DK, et al., 2019) is: 17.2%    Assessment & Plan:   Problem List Items Addressed This Visit       Cardiovascular and Mediastinum   Primary hypertension   Maintained on amlodipine  10 mg daily.  Tolerating medication well.  Blood pressure controlled  continue medication as prescribed      Relevant Medications   amLODipine  (NORVASC ) 10 MG tablet    Return if symptoms worsen or fail to improve.    Adina Crandall, NP

## 2023-12-23 ENCOUNTER — Ambulatory Visit

## 2023-12-23 VITALS — Ht 75.0 in | Wt 220.0 lb

## 2023-12-23 DIAGNOSIS — Z0000 Encounter for general adult medical examination without abnormal findings: Secondary | ICD-10-CM

## 2023-12-23 NOTE — Patient Instructions (Signed)
 Paul Dodson,  Thank you for taking the time for your Medicare Wellness Visit. I appreciate your continued commitment to your health goals. Please review the care plan we discussed, and feel free to reach out if I can assist you further.  Medicare recommends these wellness visits once per year to help you and your care team stay ahead of potential health issues. These visits are designed to focus on prevention, allowing your provider to concentrate on managing your acute and chronic conditions during your regular appointments.  Please note that Annual Wellness Visits do not include a physical exam. Some assessments may be limited, especially if the visit was conducted virtually. If needed, we may recommend a separate in-person follow-up with your provider.  Ongoing Care Seeing your primary care provider every 3 to 6 months helps us  monitor your health and provide consistent, personalized care.   Referrals If a referral was made during today's visit and you haven't received any updates within two weeks, please contact the referred provider directly to check on the status.  Recommended Screenings:  Health Maintenance  Topic Date Due   Hepatitis C Screening  Never done   Pneumococcal Vaccine for age over 32 (1 of 1 - PCV) Never done   Zoster (Shingles) Vaccine (1 of 2) Never done   Colon Cancer Screening  09/18/2022   Medicare Annual Wellness Visit  09/02/2023   Flu Shot  Never done   DTaP/Tdap/Td vaccine (2 - Td or Tdap) 05/16/2024   HPV Vaccine  Aged Out   Meningitis B Vaccine  Aged Out   COVID-19 Vaccine  Discontinued       07/04/2023    8:16 AM  Advanced Directives  Does Patient Have a Medical Advance Directive? Yes  Type of Estate agent of [place];Living will   Advance Care Planning is important because it: Ensures you receive medical care that aligns with your values, goals, and preferences. Provides guidance to your family and loved ones, reducing the  emotional burden of decision-making during critical moments.  Vision: Annual vision screenings are recommended for early detection of glaucoma, cataracts, and diabetic retinopathy. These exams can also reveal signs of chronic conditions such as diabetes and high blood pressure.  Dental: Annual dental screenings help detect early signs of oral cancer, gum disease, and other conditions linked to overall health, including heart disease and diabetes.  Please see the attached documents for additional preventive care recommendations.

## 2023-12-23 NOTE — Progress Notes (Signed)
 Subjective:   Paul Dodson is a 68 y.o. who presents for a Medicare Wellness preventive visit.  As a reminder, Annual Wellness Visits don't include a physical exam, and some assessments may be limited, especially if this visit is performed virtually. We may recommend an in-person follow-up visit with your provider if needed.  Visit Complete: Virtual I connected with  Paul Dodson on 12/23/23 by a audio enabled telemedicine application and verified that I am speaking with the correct person using two identifiers.  Patient Location: Home  Provider Location: Office/Clinic  I discussed the limitations of evaluation and management by telemedicine. The patient expressed understanding and agreed to proceed.  Vital Signs: Because this visit was a virtual/telehealth visit, some criteria may be missing or patient reported. Any vitals not documented were not able to be obtained and vitals that have been documented are patient reported.  VideoDeclined- This patient declined Librarian, academic. Therefore the visit was completed with audio only.  Persons Participating in Visit: Patient.  AWV Questionnaire: Yes: Patient Medicare AWV questionnaire was completed by the patient on 12/22/23; I have confirmed that all information answered by patient is correct and no changes since this date.  Cardiac Risk Factors include: advanced age (>54men, >70 women);male gender;dyslipidemia     Objective:    Today's Vitals   12/23/23 0814  Weight: 220 lb (99.8 kg)  Height: 6' 3 (1.905 m)   Body mass index is 27.5 kg/m.     12/23/2023    8:20 AM 07/04/2023    8:16 AM 12/29/2022   10:00 AM 10/04/2022    3:14 PM 09/02/2022    3:13 PM 09/17/2021    7:11 AM  Advanced Directives  Does Patient Have a Medical Advance Directive? Yes Yes Yes Yes Yes Yes  Type of Estate agent of [place];Living will Healthcare Power of [place];Living will Healthcare Power of  [place];Living will Healthcare Power of [place];Living will Healthcare Power of [place];Living will Healthcare Power of [place];Living will  Copy of Healthcare Power of Attorney in Chart? No - copy requested    No - copy requested No - copy requested    Current Medications (verified) Outpatient Encounter Medications as of 12/23/2023  Medication Sig   amLODipine  (NORVASC ) 10 MG tablet Take 1 tablet (10 mg total) by mouth daily.   gabapentin  (NEURONTIN ) 300 MG capsule Take 1 capsule (300 mg total) by mouth at bedtime.   No facility-administered encounter medications on file as of 12/23/2023.    Allergies (verified) Patient has no known allergies.   History: No past medical history on file. 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   Family History  Problem Relation Age of Onset   Cancer Mother 75       breast   Hyperlipidemia Father    CAD Father        CABG   Social History   Socioeconomic History   Marital status: Married    Spouse name: Not on file   Number of children: 2   Years of education: Not on file   Highest education level: 12th grade  Occupational History   Not on file  Tobacco Use   Smoking status: Never    Passive exposure:  Past   Smokeless tobacco: Never  Vaping Use   Vaping status: Never Used  Substance and Sexual Activity   Alcohol use: No   Drug use: No   Sexual activity: Not on file  Other Topics Concern   Not on file  Social History Narrative   Son and daughter      Hobbies: Surveyor, minerals and camping      Are you right handed or left handed? Right Handed    Are you currently employed ? Semi-retired   What is your current occupation?   Do you live at home alone? No   Who lives  with you? wife   What type of home do you live in: 1 story or 2 story? Lives in one story home       Social Drivers of Health   Financial Resource Strain: Low Risk  (12/22/2023)   Overall Financial Resource Strain (CARDIA)    Difficulty of Paying Living Expenses: Not hard at all  Food Insecurity: No Food Insecurity (12/22/2023)   Hunger Vital Sign    Worried About Running Out of Food in the Last Year: Never true    Ran Out of Food in the Last Year: Never true  Transportation Needs: No Transportation Needs (12/22/2023)   PRAPARE - Administrator, Civil Service (Medical): No    Lack of Transportation (Non-Medical): No  Physical Activity: Sufficiently Active (12/22/2023)   Exercise Vital Sign    Days of Exercise per Week: 7 days    Minutes of Exercise per Session: 60 min  Stress: No Stress Concern Present (12/22/2023)   [employer] of Occupational Health - Occupational Stress Questionnaire    Feeling of Stress: Not at all  Social Connections: Socially Integrated (12/22/2023)   Social Connection and Isolation Panel    Frequency of Communication with Friends and Family: More than three times a week    Frequency of Social Gatherings with Friends and Family: More than three times a week    Attends Religious Services: More than 4 times per year    Active Member of [employer] or Organizations: Yes    Attends Engineer, structural: More than 4 times per year    Marital Status: Married    Tobacco Counseling Counseling given: Not Answered    Clinical Intake:  Pre-visit preparation completed: Yes  Pain : No/denies pain     BMI - recorded: 27.5 Nutritional Status: BMI 25 -29 Overweight Nutritional Risks: None Diabetes: No  Lab Results  Component Value Date   HGBA1C 6.1 01/12/2023   HGBA1C 6.1 08/25/2021     How often do you need to have someone help you when you read instructions, pamphlets, or other written materials from your doctor or pharmacy?: 1 -  Never  Interpreter Needed?: No  Comments: lives with wife Information entered by :: B.Alayasia Breeding,LPN   Activities of Daily Living     12/22/2023    9:34 PM  In your present state of health, do you have any difficulty performing the following activities:  Hearing? 0  Vision? 0  Difficulty concentrating or making decisions? 0  Walking or climbing stairs? 0  Dressing or bathing? 0  Doing errands, shopping? 0  Preparing Food and eating ? N  Using the Toilet? N  In the past six months, have you accidently leaked urine? N  Do you have problems with loss of bowel control? N  Managing your Medications? N  Managing your Finances? N  Housekeeping or managing your  Housekeeping? N    Patient Care Team: Wendee Lynwood HERO, NP as PCP - General (Nurse Practitioner) Patel, Donika K, DO as Consulting Physician (Neurology) Joshua Rush, OD (Optometry)  I have updated your Care Teams any recent Medical Services you may have received from other providers in the past year.     Assessment:   This is a routine wellness examination for Paul Dodson.  Hearing/Vision screen Hearing Screening - Comments:: Patient denies any hearing difficulties.   Vision Screening - Comments:: Pt says their vision is good with contacts Dr  Alvaro   Goals Addressed             This Visit's Progress    Patient Stated   On track    12/23/23-Lose 17 pounds.       Depression Screen     12/23/2023    8:19 AM 10/21/2023    9:05 AM 04/11/2023    8:22 AM 02/28/2023    8:24 AM 01/12/2023    9:48 AM 09/02/2022    3:11 PM 08/24/2021    8:23 AM  PHQ 2/9 Scores  PHQ - 2 Score 0 0 0 0 0 0 0  PHQ- 9 Score  0 0 0 0      Fall Risk     12/22/2023    9:34 PM 10/21/2023    8:59 AM 07/04/2023    8:16 AM 04/11/2023    8:23 AM 02/28/2023    8:25 AM  Fall Risk   Falls in the past year? 0 0 0 0 0  Number falls in past yr: 0 0 0 0 0  Injury with Fall? 0 0 0 0 0  Risk for fall due to : No Fall Risks No Fall Risks  No Fall Risks  No Fall Risks  Follow up Falls prevention discussed;Education provided Falls evaluation completed Falls evaluation completed Falls evaluation completed Falls evaluation completed    MEDICARE RISK AT HOME:  Medicare Risk at Home Any stairs in or around the home?: (Patient-Rptd) Yes If so, are there any without handrails?: (Patient-Rptd) No Home free of loose throw rugs in walkways, pet beds, electrical cords, etc?: (Patient-Rptd) Yes Adequate lighting in your home to reduce risk of falls?: (Patient-Rptd) Yes Life alert?: (Patient-Rptd) No Use of a cane, walker or w/c?: (Patient-Rptd) No Grab bars in the bathroom?: (Patient-Rptd) Yes Shower chair or bench in shower?: (Patient-Rptd) Yes Elevated toilet seat or a handicapped toilet?: (Patient-Rptd) Yes  TIMED UP AND GO:  Was the test performed?  No  Cognitive Function: 6CIT completed        12/23/2023    8:22 AM 09/02/2022    3:15 PM  6CIT Screen  What Year? 0 points 0 points  What month? 0 points 0 points  What time? 0 points 0 points  Count back from 20 0 points 0 points  Months in reverse 0 points 0 points  Repeat phrase 0 points 0 points  Total Score 0 points 0 points    Immunizations Immunization History  Administered Date(s) Administered   Tdap 05/16/2014    Screening Tests Health Maintenance  Topic Date Due   Hepatitis C Screening  Never done   Pneumococcal Vaccine: 50+ Years (1 of 1 - PCV) Never done   Zoster Vaccines- Shingrix (1 of 2) Never done   Colonoscopy  09/18/2022   Medicare Annual Wellness (AWV)  09/02/2023   Influenza Vaccine  Never done   DTaP/Tdap/Td (2 - Td or Tdap) 05/16/2024   HPV VACCINES  Aged Out   Meningococcal B Vaccine  Aged Out   COVID-19 Vaccine  Discontinued    Health Maintenance Items Addressed: Vaccinations: declines allPatient complains of difficulty with hearing. ENT referral placed. Patient is in agreement with treatment plan. Aware that the office will call with an  appointment.  Influenza vaccine: recommend every Fall Pneumococcal vaccine: recommend once per lifetime Prevnar-20 Tdap vaccine: recommend every 10 years Shingles vaccine: recommend Shingrix which is 2 doses 2-6 months apart and over 90% effective     Covid-19: recommend 2 doses one month apart with a booster 6 months later  Additional Screening:  Vision Screening: Recommended annual ophthalmology exams for early detection of glaucoma and other disorders of the eye. Is the patient up to date with their annual eye exam?  Yes  Who is the provider or what is the name of the office in which the patient attends annual eye exams? Dr Joshua  Dental Screening: Recommended annual dental exams for proper oral hygiene  Community Resource Referral / Chronic Care Management: CRR required this visit?  No   CCM required this visit?  No   Plan:    I have personally reviewed and noted the following in the patient's chart:   Medical and social history Use of alcohol, tobacco or illicit drugs  Current medications and supplements including opioid prescriptions. Patient is not currently taking opioid prescriptions. Functional ability and status Nutritional status Physical activity Advanced directives List of other physicians Hospitalizations, surgeries, and ER visits in previous 12 months Vitals Screenings to include cognitive, depression, and falls Referrals and appointments  In addition, I have reviewed and discussed with patient certain preventive protocols, quality metrics, and best practice recommendations. A written personalized care plan for preventive services as well as general preventive health recommendations were provided to patient.   Paul LITTIE Saris, LPN   [id number]/08/[id number]   After Visit Summary: (MyChart) Due to this being a telephonic visit, the after visit summary with patients personalized plan was offered to patient via MyChart   Notes: Nothing significant to report at this  time.

## 2024-01-04 ENCOUNTER — Ambulatory Visit: Admitting: Neurology

## 2024-01-31 ENCOUNTER — Encounter: Payer: Self-pay | Admitting: Nurse Practitioner

## 2024-01-31 ENCOUNTER — Ambulatory Visit: Admitting: Nurse Practitioner

## 2024-01-31 VITALS — BP 148/80 | HR 80 | Temp 98.2°F | Ht 72.0 in | Wt 221.0 lb

## 2024-01-31 DIAGNOSIS — Z0000 Encounter for general adult medical examination without abnormal findings: Secondary | ICD-10-CM | POA: Diagnosis not present

## 2024-01-31 DIAGNOSIS — E663 Overweight: Secondary | ICD-10-CM | POA: Diagnosis not present

## 2024-01-31 DIAGNOSIS — R7303 Prediabetes: Secondary | ICD-10-CM | POA: Diagnosis not present

## 2024-01-31 DIAGNOSIS — E7800 Pure hypercholesterolemia, unspecified: Secondary | ICD-10-CM

## 2024-01-31 DIAGNOSIS — I10 Essential (primary) hypertension: Secondary | ICD-10-CM | POA: Diagnosis not present

## 2024-01-31 DIAGNOSIS — Z125 Encounter for screening for malignant neoplasm of prostate: Secondary | ICD-10-CM

## 2024-01-31 DIAGNOSIS — R200 Anesthesia of skin: Secondary | ICD-10-CM | POA: Diagnosis not present

## 2024-01-31 LAB — COMPREHENSIVE METABOLIC PANEL WITH GFR
ALT: 19 U/L (ref 0–53)
AST: 18 U/L (ref 0–37)
Albumin: 4.1 g/dL (ref 3.5–5.2)
Alkaline Phosphatase: 62 U/L (ref 39–117)
BUN: 12 mg/dL (ref 6–23)
CO2: 29 meq/L (ref 19–32)
Calcium: 8.8 mg/dL (ref 8.4–10.5)
Chloride: 103 meq/L (ref 96–112)
Creatinine, Ser: 0.88 mg/dL (ref 0.40–1.50)
GFR: 88.73 mL/min (ref >60.00)
Glucose, Bld: 98 mg/dL (ref 70–99)
Potassium: 4.2 meq/L (ref 3.5–5.1)
Sodium: 139 meq/L (ref 135–145)
Total Bilirubin: 0.7 mg/dL (ref 0.2–1.2)
Total Protein: 6.7 g/dL (ref 6.0–8.3)

## 2024-01-31 LAB — LIPID PANEL
Cholesterol: 147 mg/dL (ref 0–200)
HDL: 37.5 mg/dL — ABNORMAL LOW (ref >39.00)
LDL Cholesterol: 96 mg/dL (ref 0–99)
NonHDL: 109.75
Total CHOL/HDL Ratio: 4
Triglycerides: 68 mg/dL (ref 0.0–149.0)
VLDL: 13.6 mg/dL (ref 0.0–40.0)

## 2024-01-31 LAB — CBC WITH DIFFERENTIAL/PLATELET
Basophils Absolute: 0 K/uL (ref 0.0–0.1)
Basophils Relative: 0.8 % (ref 0.0–3.0)
Eosinophils Absolute: 0.1 K/uL (ref 0.0–0.7)
Eosinophils Relative: 3.8 % (ref 0.0–5.0)
HCT: 42 % (ref 39.0–52.0)
Hemoglobin: 14.6 g/dL (ref 13.0–17.0)
Lymphocytes Relative: 29.8 % (ref 12.0–46.0)
Lymphs Abs: 0.8 K/uL (ref 0.7–4.0)
MCHC: 34.8 g/dL (ref 30.0–36.0)
MCV: 98.1 fl (ref 78.0–100.0)
Monocytes Absolute: 0.4 K/uL (ref 0.1–1.0)
Monocytes Relative: 15.8 % — ABNORMAL HIGH (ref 3.0–12.0)
Neutro Abs: 1.4 K/uL (ref 1.4–7.7)
Neutrophils Relative %: 49.8 % (ref 43.0–77.0)
Platelets: 171 K/uL (ref 150.0–400.0)
RBC: 4.29 Mil/uL (ref 4.22–5.81)
RDW: 14.7 % (ref 11.5–15.5)
WBC: 2.8 K/uL — ABNORMAL LOW (ref 4.0–10.5)

## 2024-01-31 LAB — HEMOGLOBIN A1C: Hgb A1c MFr Bld: 6.1 % (ref 4.6–6.5)

## 2024-01-31 LAB — MICROALBUMIN / CREATININE URINE RATIO
Creatinine,U: 74.5 mg/dL
Microalb Creat Ratio: 14.6 mg/g (ref 0.0–30.0)
Microalb, Ur: 1.1 mg/dL (ref 0.0–1.9)

## 2024-01-31 LAB — TSH: TSH: 1.83 u[IU]/mL (ref 0.35–5.50)

## 2024-01-31 LAB — PSA, MEDICARE: PSA: 0.97 ng/mL (ref 0.10–4.00)

## 2024-01-31 NOTE — Patient Instructions (Signed)
 Nice to see you today I will be in touch with the labs once I have them  Follow up with me in 1 year, sooner if you need me  Continue checking blood pressure at home I want the readings to be under 140 on the top and the readings on the bottom under 90.

## 2024-01-31 NOTE — Assessment & Plan Note (Signed)
 History of the same.  Pending lipid panel today

## 2024-01-31 NOTE — Assessment & Plan Note (Signed)
 Has seen and been evaluated by neurology.  Recommended gabapentin .  Patient recently stopped gabapentin  due to long-term effects and some side effects.  Stable at this juncture

## 2024-01-31 NOTE — Progress Notes (Signed)
 Established Patient Office Visit  Subjective   Patient ID: Paul Dodson, male    DOB: November 11, 1955  Age: 68 y.o. MRN: [id number]  Chief Complaint  Patient presents with   Annual Exam    HPI  HTN: Patient currently maintained on amlodipine  10 mg daily. He has been chiecking it at night. He is getting 127/86-87  Neuropathy: Patient was maintained on gabapentin  for tingling in his hands. He did some reading and stopped taking the medications. He has been off for two weeks and some of the side effects such as off balance and grogginess have resolved.   for complete physical and follow up of chronic conditions.  Immunizations: -Tetanus: Completed in 2016 -Influenza:  Refused  -Shingles:  Refused  -Pneumonia:  Refused   Diet: Fair diet. He is eating 2 larger and 1 samller meal. He does snack like crackers. He will drink coffee, water, hot tea, Exercise: No regular exercise. 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  Patient like to hold off at this juncture Lung Cancer Screening: N/A  PSA: Due  Sleep: goes to bed 10 and get up around 7 and he does snore       Review of Systems  Constitutional:  Negative for chills and fever.  Respiratory:  Negative for shortness of breath.   Cardiovascular:  Negative for chest pain and leg swelling.  Gastrointestinal:  Negative for abdominal pain, blood in stool, constipation, diarrhea, nausea and vomiting.       Bm daily   Genitourinary:  Negative for dysuria and hematuria.  Neurological:  Positive for tingling. Negative for dizziness and headaches.  Psychiatric/Behavioral:  Negative for hallucinations and suicidal ideas.       Objective:     BP (!) 148/80   Pulse 80   Temp 98.2 F (36.8 C) (Oral)   Ht 6' (1.829 m)   Wt 221 lb (100.2 kg)   SpO2 96%   BMI 29.97 kg/m  BP Readings from Last 3  Encounters:  01/31/24 (!) 148/80  10/21/23 134/80  07/04/23 (!) 172/86   Wt Readings from Last 3 Encounters:  01/31/24 221 lb (100.2 kg)  12/23/23 220 lb (99.8 kg)  10/21/23 230 lb (104.3 kg)   SpO2 Readings from Last 3 Encounters:  01/31/24 96%  10/21/23 95%  07/04/23 99%      Physical Exam Vitals and nursing note reviewed.  Constitutional:      Appearance: Normal appearance.  HENT:     Right Ear: Tympanic membrane, ear canal and external ear normal.     Left Ear: Tympanic membrane, ear canal and external ear normal.     Mouth/Throat:     Mouth: Mucous membranes are moist.     Pharynx: Oropharynx is clear.  Eyes:     Extraocular Movements: Extraocular movements intact.     Pupils: Pupils are equal, round, and reactive to light.  Cardiovascular:     Rate and Rhythm: Normal rate and regular rhythm.     Pulses: Normal pulses.     Heart sounds: Normal heart sounds.  Pulmonary:     Effort: Pulmonary effort is normal.     Breath sounds: Normal breath sounds.  Abdominal:     General: Bowel sounds are normal. There is no distension.     Palpations: There is no mass.     Tenderness: There  is no abdominal tenderness.     Hernia: No hernia is present.  Musculoskeletal:     Right lower leg: No edema.     Left lower leg: No edema.  Lymphadenopathy:     Cervical: No cervical adenopathy.  Skin:    General: Skin is warm.  Neurological:     General: No focal deficit present.     Mental Status: He is alert.     Deep Tendon Reflexes:     Reflex Scores:      Bicep reflexes are 2+ on the right side and 2+ on the left side.      Patellar reflexes are 2+ on the right side and 2+ on the left side.    Comments: Bilateral upper and lower extremity strength 5/5  Psychiatric:        Mood and Affect: Mood normal.        Behavior: Behavior normal.        Thought Content: Thought content normal.        Judgment: Judgment normal.      No results found for any visits on  01/31/24.    The 10-year ASCVD risk score (Arnett DK, et al., 2019) is: 20.2%    Assessment & Plan:   Problem List Items Addressed This Visit       Cardiovascular and Mediastinum   Primary hypertension   Patient currently maintained on amlodipine  10 mg daily.  Does check blood pressure at home with readings within normal limits.  Blood pressure elevated upon initial and recheck here parameters given when checking at home if outside parameters he will let me know.  UACR done today      Relevant Orders   CBC with Differential/Platelet   Comprehensive metabolic panel with GFR   Hemoglobin A1c   Microalbumin / creatinine urine ratio   TSH   Lipid panel     Other   Preventative health care - Primary   Discussed age-appropriate immunizations and screening exams.  Did review patient's personal, surgical, social, family histories.  Patient is up-to-date with all age-appropriate vaccinations and would like.  He declined flu, shingles, pneumonia vaccine today.  Patient is overdue for repeat CRC screening.  Patient would like to defer at this current juncture.  PSA for prostate cancer screening today.  Patient was given information at discharge about preventative healthcare maintenance with anticipatory guidance.      Relevant Orders   CBC with Differential/Platelet   Comprehensive metabolic panel with GFR   TSH   Numbness of hand   Has seen and been evaluated by neurology.  Recommended gabapentin .  Patient recently stopped gabapentin  due to long-term effects and some side effects.  Stable at this juncture      Prediabetes   History of the same pending A1c today      Relevant Orders   Hemoglobin A1c   Lipid panel   Overweight   Pending A1c, lipid panel, TSH.      Relevant Orders   Hemoglobin A1c   TSH   Lipid panel   Elevated LDL cholesterol level   History of the same.  Pending lipid panel today      Relevant Orders   Hemoglobin A1c   Lipid panel   Other Visit  Diagnoses       Screening for prostate cancer       Relevant Orders   PSA, Medicare       Return in about 1 year (around 01/30/2025) for CPE and  Labs.    Adina Crandall, NP

## 2024-01-31 NOTE — Assessment & Plan Note (Signed)
History of the same pending A1c today. ?

## 2024-01-31 NOTE — Assessment & Plan Note (Signed)
Pending A1c, lipid panel, TSH.

## 2024-01-31 NOTE — Assessment & Plan Note (Signed)
 Discussed age-appropriate immunizations and screening exams.  Did review patient's personal, surgical, social, family histories.  Patient is up-to-date with all age-appropriate vaccinations and would like.  He declined flu, shingles, pneumonia vaccine today.  Patient is overdue for repeat CRC screening.  Patient would like to defer at this current juncture.  PSA for prostate cancer screening today.  Patient was given information at discharge about preventative healthcare maintenance with anticipatory guidance.

## 2024-01-31 NOTE — Assessment & Plan Note (Signed)
 Patient currently maintained on amlodipine  10 mg daily.  Does check blood pressure at home with readings within normal limits.  Blood pressure elevated upon initial and recheck here parameters given when checking at home if outside parameters he will let me know.  UACR done today

## 2024-02-03 ENCOUNTER — Ambulatory Visit: Payer: Self-pay | Admitting: Nurse Practitioner

## 2024-02-16 ENCOUNTER — Other Ambulatory Visit: Payer: Self-pay | Admitting: Neurology

## 2024-12-26 ENCOUNTER — Ambulatory Visit

## 2025-01-31 ENCOUNTER — Encounter: Admitting: Nurse Practitioner
